# Patient Record
Sex: Male | Born: 1965
Health system: Southern US, Community
[De-identification: ages and names within clinical notes are randomized; demographics above are authoritative.]

## PROBLEM LIST (undated history)

## (undated) DIAGNOSIS — M5412 Radiculopathy, cervical region: Secondary | ICD-10-CM

## (undated) DIAGNOSIS — E785 Hyperlipidemia, unspecified: Secondary | ICD-10-CM

## (undated) DIAGNOSIS — C801 Malignant (primary) neoplasm, unspecified: Secondary | ICD-10-CM

## (undated) DIAGNOSIS — T7840XA Allergy, unspecified, initial encounter: Secondary | ICD-10-CM

## (undated) HISTORY — DX: Allergy, unspecified, initial encounter: T78.40XA

## (undated) HISTORY — DX: Radiculopathy, cervical region: M54.12

## (undated) HISTORY — DX: Hyperlipidemia, unspecified: E78.5

## (undated) HISTORY — DX: Malignant (primary) neoplasm, unspecified: C80.1

---

## 1996-08-31 HISTORY — PX: CHOLECYSTECTOMY: SHX55

## 2004-07-03 ENCOUNTER — Ambulatory Visit: Payer: Self-pay | Admitting: Internal Medicine

## 2004-07-21 ENCOUNTER — Encounter: Admission: RE | Admit: 2004-07-21 | Discharge: 2004-07-21 | Payer: Self-pay | Admitting: Internal Medicine

## 2004-07-22 ENCOUNTER — Ambulatory Visit: Payer: Self-pay | Admitting: Internal Medicine

## 2004-08-04 ENCOUNTER — Ambulatory Visit: Payer: Self-pay | Admitting: Internal Medicine

## 2004-08-08 ENCOUNTER — Ambulatory Visit: Payer: Self-pay | Admitting: Internal Medicine

## 2004-10-17 ENCOUNTER — Ambulatory Visit: Payer: Self-pay | Admitting: Internal Medicine

## 2005-01-06 ENCOUNTER — Ambulatory Visit: Payer: Self-pay | Admitting: Internal Medicine

## 2005-01-14 ENCOUNTER — Ambulatory Visit: Payer: Self-pay | Admitting: Internal Medicine

## 2005-04-16 ENCOUNTER — Ambulatory Visit: Payer: Self-pay | Admitting: Internal Medicine

## 2005-09-30 ENCOUNTER — Ambulatory Visit: Payer: Self-pay | Admitting: Internal Medicine

## 2005-12-10 ENCOUNTER — Ambulatory Visit: Payer: Self-pay | Admitting: Internal Medicine

## 2006-04-05 ENCOUNTER — Ambulatory Visit: Payer: Self-pay | Admitting: Internal Medicine

## 2006-04-19 ENCOUNTER — Ambulatory Visit: Payer: Self-pay | Admitting: Internal Medicine

## 2006-05-07 ENCOUNTER — Encounter: Admission: RE | Admit: 2006-05-07 | Discharge: 2006-08-05 | Payer: Self-pay | Admitting: Internal Medicine

## 2006-10-11 ENCOUNTER — Ambulatory Visit: Payer: Self-pay | Admitting: Internal Medicine

## 2007-01-13 DIAGNOSIS — E119 Type 2 diabetes mellitus without complications: Secondary | ICD-10-CM

## 2007-01-18 ENCOUNTER — Encounter: Payer: Self-pay | Admitting: Internal Medicine

## 2007-01-18 ENCOUNTER — Ambulatory Visit: Payer: Self-pay | Admitting: Internal Medicine

## 2007-01-31 LAB — CONVERTED CEMR LAB
BUN: 11 mg/dL (ref 6–23)
CO2: 28 meq/L (ref 19–32)
Calcium: 9.2 mg/dL (ref 8.4–10.5)
Chloride: 106 meq/L (ref 96–112)
Creatinine, Ser: 0.7 mg/dL (ref 0.4–1.5)
Creatinine,U: 202.5 mg/dL
Microalb Creat Ratio: 3.5 mg/g (ref 0.0–30.0)
Microalb, Ur: 0.7 mg/dL (ref 0.0–1.9)

## 2007-04-18 ENCOUNTER — Ambulatory Visit: Payer: Self-pay | Admitting: Internal Medicine

## 2007-04-19 LAB — CONVERTED CEMR LAB
ALT: 27 units/L (ref 0–53)
AST: 20 units/L (ref 0–37)
Basophils Absolute: 0 10*3/uL (ref 0.0–0.1)
Eosinophils Relative: 1.9 % (ref 0.0–5.0)
HCT: 41 % (ref 39.0–52.0)
Neutrophils Relative %: 50.7 % (ref 43.0–77.0)
Platelets: 293 10*3/uL (ref 150–400)
RBC: 4.65 M/uL (ref 4.22–5.81)
RDW: 12 % (ref 11.5–14.6)
WBC: 6.5 10*3/uL (ref 4.5–10.5)

## 2007-05-30 ENCOUNTER — Ambulatory Visit: Payer: Self-pay | Admitting: Internal Medicine

## 2007-06-02 ENCOUNTER — Encounter: Payer: Self-pay | Admitting: Internal Medicine

## 2007-07-19 ENCOUNTER — Ambulatory Visit: Payer: Self-pay | Admitting: Internal Medicine

## 2007-07-19 DIAGNOSIS — M7660 Achilles tendinitis, unspecified leg: Secondary | ICD-10-CM | POA: Insufficient documentation

## 2007-08-01 LAB — CONVERTED CEMR LAB
BUN: 14 mg/dL (ref 6–23)
CO2: 25 meq/L (ref 19–32)
Calcium: 9.3 mg/dL (ref 8.4–10.5)
Cholesterol: 194 mg/dL (ref 0–200)
Creatinine, Ser: 0.7 mg/dL (ref 0.4–1.5)
GFR calc Af Amer: 161 mL/min
Glucose, Bld: 130 mg/dL — ABNORMAL HIGH (ref 70–99)
Total CHOL/HDL Ratio: 4.3
Triglycerides: 104 mg/dL (ref 0–149)

## 2007-10-19 ENCOUNTER — Ambulatory Visit: Payer: Self-pay | Admitting: Internal Medicine

## 2007-10-25 LAB — CONVERTED CEMR LAB: Hgb A1c MFr Bld: 6.4 % — ABNORMAL HIGH (ref 4.6–6.0)

## 2008-02-20 ENCOUNTER — Encounter (INDEPENDENT_AMBULATORY_CARE_PROVIDER_SITE_OTHER): Payer: Self-pay | Admitting: *Deleted

## 2008-03-06 ENCOUNTER — Telehealth (INDEPENDENT_AMBULATORY_CARE_PROVIDER_SITE_OTHER): Payer: Self-pay | Admitting: *Deleted

## 2008-03-29 ENCOUNTER — Telehealth (INDEPENDENT_AMBULATORY_CARE_PROVIDER_SITE_OTHER): Payer: Self-pay | Admitting: *Deleted

## 2008-04-30 ENCOUNTER — Telehealth (INDEPENDENT_AMBULATORY_CARE_PROVIDER_SITE_OTHER): Payer: Self-pay | Admitting: *Deleted

## 2008-08-31 HISTORY — PX: SHOULDER ARTHROSCOPY: SHX128

## 2008-09-13 ENCOUNTER — Ambulatory Visit: Payer: Self-pay | Admitting: Internal Medicine

## 2008-09-18 ENCOUNTER — Encounter: Payer: Self-pay | Admitting: Internal Medicine

## 2008-09-18 ENCOUNTER — Encounter (INDEPENDENT_AMBULATORY_CARE_PROVIDER_SITE_OTHER): Payer: Self-pay | Admitting: *Deleted

## 2008-09-18 LAB — CONVERTED CEMR LAB
ALT: 33 units/L (ref 0–53)
BUN: 9 mg/dL (ref 6–23)
Calcium: 9.1 mg/dL (ref 8.4–10.5)
Cholesterol: 188 mg/dL (ref 0–200)
Creatinine, Ser: 0.7 mg/dL (ref 0.4–1.5)
GFR calc Af Amer: 161 mL/min
GFR calc non Af Amer: 133 mL/min
Glucose, Bld: 105 mg/dL — ABNORMAL HIGH (ref 70–99)
HDL: 47.8 mg/dL (ref >39.0)
LDL Cholesterol: 121 mg/dL — ABNORMAL HIGH (ref 0–99)
Microalb Creat Ratio: 5.2 mg/g (ref 0.0–30.0)
Potassium: 4 meq/L (ref 3.5–5.1)
Triglycerides: 97 mg/dL (ref 0–149)
VLDL: 19 mg/dL (ref 0–40)

## 2008-12-12 ENCOUNTER — Encounter (INDEPENDENT_AMBULATORY_CARE_PROVIDER_SITE_OTHER): Payer: Self-pay | Admitting: *Deleted

## 2009-01-14 ENCOUNTER — Ambulatory Visit: Payer: Self-pay | Admitting: Family Medicine

## 2009-01-14 DIAGNOSIS — L500 Allergic urticaria: Secondary | ICD-10-CM

## 2009-01-14 DIAGNOSIS — J301 Allergic rhinitis due to pollen: Secondary | ICD-10-CM

## 2009-01-22 ENCOUNTER — Encounter (INDEPENDENT_AMBULATORY_CARE_PROVIDER_SITE_OTHER): Payer: Self-pay | Admitting: *Deleted

## 2009-01-22 LAB — CONVERTED CEMR LAB: IgE (Immunoglobulin E), Serum: 201.2 intl units/mL — ABNORMAL HIGH (ref 0.0–180.0)

## 2009-01-29 ENCOUNTER — Encounter (INDEPENDENT_AMBULATORY_CARE_PROVIDER_SITE_OTHER): Payer: Self-pay | Admitting: *Deleted

## 2009-02-20 ENCOUNTER — Telehealth: Payer: Self-pay | Admitting: Internal Medicine

## 2009-04-23 ENCOUNTER — Encounter: Payer: Self-pay | Admitting: Internal Medicine

## 2009-06-18 ENCOUNTER — Ambulatory Visit: Payer: Self-pay | Admitting: Internal Medicine

## 2009-06-24 LAB — CONVERTED CEMR LAB
ALT: 29 units/L (ref 0–53)
AST: 21 units/L (ref 0–37)
CO2: 25 meq/L (ref 19–32)
Calcium: 9 mg/dL (ref 8.4–10.5)
Creatinine, Ser: 0.8 mg/dL (ref 0.4–1.5)
GFR calc non Af Amer: 113.19 mL/min (ref >60)
Hgb A1c MFr Bld: 6.7 % — ABNORMAL HIGH (ref 4.6–6.5)
Sodium: 141 meq/L (ref 135–145)

## 2009-09-13 ENCOUNTER — Ambulatory Visit: Payer: Self-pay | Admitting: Internal Medicine

## 2009-09-13 DIAGNOSIS — E7800 Pure hypercholesterolemia, unspecified: Secondary | ICD-10-CM

## 2009-09-19 ENCOUNTER — Encounter (INDEPENDENT_AMBULATORY_CARE_PROVIDER_SITE_OTHER): Payer: Self-pay | Admitting: *Deleted

## 2009-09-19 LAB — CONVERTED CEMR LAB
Cholesterol: 190 mg/dL (ref 0–200)
LDL Cholesterol: 120 mg/dL — ABNORMAL HIGH (ref 0–99)
Triglycerides: 92 mg/dL (ref 0.0–149.0)

## 2009-12-17 ENCOUNTER — Ambulatory Visit: Payer: Self-pay | Admitting: Internal Medicine

## 2010-01-06 ENCOUNTER — Ambulatory Visit: Payer: Self-pay | Admitting: Family Medicine

## 2010-01-06 ENCOUNTER — Encounter (INDEPENDENT_AMBULATORY_CARE_PROVIDER_SITE_OTHER): Payer: Self-pay | Admitting: *Deleted

## 2010-03-18 ENCOUNTER — Ambulatory Visit: Payer: Self-pay | Admitting: Internal Medicine

## 2010-05-14 ENCOUNTER — Telehealth (INDEPENDENT_AMBULATORY_CARE_PROVIDER_SITE_OTHER): Payer: Self-pay | Admitting: *Deleted

## 2010-06-04 ENCOUNTER — Ambulatory Visit: Payer: Self-pay | Admitting: Internal Medicine

## 2010-06-04 DIAGNOSIS — M549 Dorsalgia, unspecified: Secondary | ICD-10-CM | POA: Insufficient documentation

## 2010-06-04 LAB — HM DIABETES FOOT EXAM

## 2010-06-05 LAB — CONVERTED CEMR LAB: Hgb A1c MFr Bld: 7.2 % — ABNORMAL HIGH (ref 4.6–6.5)

## 2010-06-16 ENCOUNTER — Encounter
Admission: RE | Admit: 2010-06-16 | Discharge: 2010-06-16 | Payer: Self-pay | Source: Home / Self Care | Attending: Internal Medicine | Admitting: Internal Medicine

## 2010-09-28 LAB — CONVERTED CEMR LAB
BUN: 7 mg/dL (ref 6–23)
Basophils Absolute: 0 10*3/uL (ref 0.0–0.1)
Bilirubin, Direct: 0.1 mg/dL (ref 0.0–0.3)
Chloride: 107 meq/L (ref 96–112)
Cholesterol: 172 mg/dL (ref 0–200)
Creatinine, Ser: 0.7 mg/dL (ref 0.4–1.5)
Eosinophils Absolute: 0.1 10*3/uL (ref 0.0–0.7)
Glucose, Bld: 128 mg/dL — ABNORMAL HIGH (ref 70–99)
HCT: 42.1 % (ref 39.0–52.0)
HDL: 47.6 mg/dL (ref >39.00)
LDL Cholesterol: 92 mg/dL (ref 0–99)
Lymphs Abs: 2 10*3/uL (ref 0.7–4.0)
MCV: 90.2 fL (ref 78.0–100.0)
Microalb Creat Ratio: 6 mg/g (ref 0.0–30.0)
Monocytes Absolute: 0.6 10*3/uL (ref 0.1–1.0)
Neutrophils Relative %: 51.4 % (ref 43.0–77.0)
Platelets: 259 10*3/uL (ref 150.0–400.0)
Potassium: 3.7 meq/L (ref 3.5–5.1)
RDW: 12.6 % (ref 11.5–14.6)
Total Bilirubin: 0.7 mg/dL (ref 0.3–1.2)
Total CHOL/HDL Ratio: 4
Triglycerides: 163 mg/dL — ABNORMAL HIGH (ref 0.0–149.0)
VLDL: 32.6 mg/dL (ref 0.0–40.0)
WBC: 5.8 10*3/uL (ref 4.5–10.5)

## 2010-09-30 NOTE — Letter (Signed)
Summary: lipid results      [place] [facility] [address] [place], [place]  [address] Phone: [phone]      September 19, 2009   [name] [address] [place], [place] [address]  RE:  LAB RESULTS  Dear  [name],  The following is an interpretation of your most recent lab tests.  Please take note of any instructions provided or changes to medications that have resulted from your lab work.  LIPID PANEL:  Please see the enclosed Rx for a change in medication Triglyceride: 92.0   Cholesterol: 190   LDL: 120   HDL: 51.70   Chol/HDL%:  4   Dr. Drue Novel wants your LDL (bad cholesterol)  to be less than 100.  Please increase simvastatin to 40mg .  I have enclosed a prescription.  Call me if you have any questions or concerns. [phone] ext [phone]   Sincerely Yours,    [name], CMA

## 2010-09-30 NOTE — Assessment & Plan Note (Signed)
Summary: PER PHONE NOTE, NEEDS OFFICE VISIT--WILL COME FASTING///SPH   Vital Signs:  Patient profile:   45 year old male Weight:      292 pounds Pulse rate:   76 / minute Pulse rhythm:   regular BP sitting:   126 / 82  (left arm) Cuff size:   large  Vitals Entered By: Army Fossa CMA (June 04, 2010 8:43 AM) CC: follow up visit, fasting  Comments c/o lower back pain x 3 weeks (feels muscle releated)  [facility] [address]    History of Present Illness: office visit  DIABETES--  ambulatory CBGs in the 170s as far as his diet, reports he has eliminated fast food , started to walk up until 3 weeks ago d/t back pain, see below  Hyperlipidemia-- off meds, las FLP satisfactory    c/o lower back pain x 3 weeks , B lower back no radiation, no assoc symptoms like tingling in the LE, has been using local heat and tylenol, no injury that he can tell ; better w/ a hot shower, worse if stays in the same position x a while   Current Medications (verified): 1)  Amaryl 4 Mg Tabs (Glimepiride) .... Take 1 Tablet By Mouth Once A Day 2)  Actos 45 Mg Tabs (Pioglitazone Hcl) .... Take 1 Tablet By Mouth Once A Day 3)  Xyzal 5 Mg Tabs (Levocetirizine Dihydrochloride) .Marland Kitchen.. 1 By Mouth Once Daily 4)  Ketoconazole 2 % Crea (Ketoconazole) .... Two Times A Day X 2 Weeks 5)  One Touch Ultra 2 Test Strips & Lancets .... Checks Bs 2x/day; Dx 250.00  Allergies (verified): No Known Drug Allergies  Past History:  Past Medical History: Reviewed history from 09/13/2009 and no changes required. DIABETES MELLITUS, TYPE II , Dx 98 Hyperlipidemia  Past Surgical History: Reviewed history from 09/13/2008 and no changes required. Cholecystectomy '98  Social History: Reviewed history from 12/17/2009 and no changes required. Married 1 daughter works at [employer]  tobacco-- no ETOH-- rarely not exercising  diet-- on-off   Review of Systems General:  Denies fever. CV:   Denies chest pain or discomfort and shortness of breath with exertion. Resp:  Denies cough and shortness of breath. GI:  Denies nausea and vomiting. GU:  Denies dysuria and hematuria. Endo:   low sugar symptoms x 2? , had skip a meal and got a headache, he feels that HA related to low sugar.  Physical Exam  General:  alert, well-developed, and overweight-appearing.   Lungs:  normal respiratory effort, no intercostal retractions, no accessory muscle use, and normal breath sounds.   Heart:  normal rate, regular rhythm, no murmur, and no gallop.   Pulses:  normal pedal pulses bilaterally  Extremities:  no pretibial edema bilaterally  Neurologic:  DTRs, motor exam wnl @ LE  Diabetes Management Exam:    Foot Exam (with socks and/or shoes not present):       Sensory-Pinprick/Light touch:          Left medial foot (L-4): normal          Left dorsal foot (L-5): normal          Left lateral foot (S-1): normal          Right medial foot (L-4): normal          Right dorsal foot (L-5): normal          Right lateral foot (S-1): normal       Sensory-Monofilament:  Left foot: normal          Right foot: normal       Inspection:          Left foot: normal          Right foot: normal       Nails:          Left foot: normal          Right foot: normal   Impression & Recommendations:  Problem # 1:  BACK PAIN (ICD-724.5) Assessment New likely muscle skeletal. see instructions   His updated medication list for this problem includes:    Flexeril 10 Mg Tabs (Cyclobenzaprine hcl) .Marland Kitchen... 1 by mouth at bedtime as needed pain  Problem # 2:  DIABETES MELLITUS, TYPE II (ICD-250.00) labs had a conversation w/ patient, not loosing weight depite "quitting fast food"; later on , he admited  to eating poorly (lots of carbs, large portions) we agreed to refer to  a nutritionist,  exercise more  labs  His updated medication list for this problem includes:    Amaryl 4 Mg Tabs (Glimepiride) .Marland Kitchen...  Take 1 tablet by mouth once a day    Actos 45 Mg Tabs (Pioglitazone hcl) .Marland Kitchen... Take 1 tablet by mouth once a day  Orders: Venipuncture (75643) TLB-A1C / Hgb A1C (Glycohemoglobin) (83036-A1C) Specimen Handling (32951) Nutrition Referral (Nutrition)  Problem # 3:  HYPERLIPIDEMIA (ICD-272.4) not on meds  last FLP satisfactory   Labs Reviewed: SGOT: 18 (12/17/2009)   SGPT: 25 (12/17/2009)   HDL:47.60 (12/17/2009), 51.70 (09/13/2009)  LDL:92 (12/17/2009), 120 (88/41/6606)  Chol:172 (12/17/2009), 190 (09/13/2009)  Trig:163.0 (12/17/2009), 92.0 (09/13/2009)  Complete Medication List: 1)  Amaryl 4 Mg Tabs (Glimepiride) .... Take 1 tablet by mouth once a day 2)  Actos 45 Mg Tabs (Pioglitazone hcl) .... Take 1 tablet by mouth once a day 3)  Xyzal 5 Mg Tabs (Levocetirizine dihydrochloride) .Marland Kitchen.. 1 by mouth once daily 4)  Ketoconazole 2 % Crea (Ketoconazole) .... Two times a day x 2 weeks 5)  One Touch Ultra 2 Test Strips & Lancets  .... Checks bs 2x/day; dx 250.00 6)  Flexeril 10 Mg Tabs (Cyclobenzaprine hcl) .Marland Kitchen.. 1 by mouth at bedtime as needed pain  Other Orders: Admin 1st Vaccine (30160) Flu Vaccine 59yrs + ([address])   Patient Instructions: 1)  rest, warm compress 2)  Take -600 mg of Ibuprofen (Advil, Motrin) with food every 8 hours as needed  for relief of pain, watch for stomach side effects 3)  flexeril, muscle relaxant at night, will cause drowsines 4)  call if no better in 3 weeks  5)  Please schedule a follow-up appointment in 4 months .  6)  exercise reguarly at least 30  minutes 6 times a week. Prescriptions: FLEXERIL 10 MG TABS (CYCLOBENZAPRINE HCL) 1 by mouth at bedtime as needed pain  #21 x 0   Entered and Authorized by:   Nolon Rod. Paz MD   Signed by:   Nolon Rod. Paz MD on 06/04/2010   Method used:   [facility] to        [facility] [phone]* (retail)       [address]       [place], [place]  [address]       Ph: [phone]       Fax:  [phone]   RxID:   [id number]  Flu Vaccine Consent Questions     Do you  have a history of severe allergic reactions to this vaccine? no    Any prior history of allergic reactions to egg and/or gelatin? no    Do you have a sensitivity to the preservative Thimersol? no    Do you have a past history of Guillan-Barre Syndrome? no    Do you currently have an acute febrile illness? no    Have you ever had a severe reaction to latex? no    Vaccine information given and explained to patient? yes    Are you currently pregnant? no    Lot Number:[id number]   Exp Date:02/28/2011   Site Given  Left Deltoid IM       Ph: [phone]       Fax: [phone]   RxID:   [id number]    .lbflu

## 2010-09-30 NOTE — Assessment & Plan Note (Signed)
Summary: CPX/NS/KDC   Vital Signs:  Patient profile:   45 year old male Height:      70 inches Weight:      288.6 pounds BMI:     41.56 Pulse rate:   90 / minute BP sitting:   122 / 80  Vitals Entered By: [name] (December 17, 2009 8:48 AM) CC: cpx - fasting Is Patient Diabetic? Yes Comments FBS this am 156, avg BS @ home 160's [name]  December 17, 2009 8:49 AM    History of Present Illness: CPX --fungal infection on the foot came back in another spot, using left over cream --still having problems w/ achilles tendon pain on-off x 2 years , always a little swollen. Pain increased afetr physical activity --based on the last cholesterol panel, simvastatin was increased, patient however not taking it   Preventive Screening-Counseling & Management  Alcohol-Tobacco     Alcohol drinks/day: <1  Current Medications (verified): 1)  Amaryl 4 Mg Tabs (Glimepiride) .... Take 1 Tablet By Mouth Once A Day 2)  Actos 45 Mg Tabs (Pioglitazone Hcl) .... Take 1 Tablet By Mouth Once A Day 3)  Singulair 10 Mg Tabs (Montelukast Sodium) .Marland Kitchen.. 1 By Mouth Once Daily 4)  Xyzal 5 Mg Tabs (Levocetirizine Dihydrochloride) .Marland Kitchen.. 1 By Mouth Once Daily 5)  One Touch Ultra 2 Test Strips & Lancets .... Checks Bs 2x/day; Dx 250.00  Allergies (verified): No Known Drug Allergies  Past History:  Past Medical History: Reviewed history from 09/13/2009 and no changes required. DIABETES MELLITUS, TYPE II , Dx 98 Hyperlipidemia  Past Surgical History: Reviewed history from 09/13/2008 and no changes required. Cholecystectomy '98  Family History: Reviewed history from 09/13/2008 and no changes required. colon ca--no  prostate ca--no breast ca--M, aunt DM-- (+)  GM M uncle  MI-- no  Social History: Married 1 daughter works at [employer]  tobacco-- no ETOH-- rarely not exercising  diet-- on-off   Review of Systems General:  Denies fatigue, fever, and weight loss; no exercising at all  "is a matter of lack of time and priorities". CV:  Denies chest pain or discomfort and swelling of feet. Resp:  Denies cough and shortness of breath. GI:  Denies bloody stools, nausea, and vomiting. GU:  Denies hematuria, urinary frequency, and urinary hesitancy.  Physical Exam  General:  alert, well-developed, well-nourished, and overweight-appearing.   Neck:  no masses and no thyromegaly.   Lungs:  normal respiratory effort, no intercostal retractions, no accessory muscle use, and normal breath sounds.   Heart:  normal rate, regular rhythm, no murmur, and no gallop.   Abdomen:  soft, non-tender, normal bowel sounds, and no distention.   Extremities:  no pretibial edema R distal Achilles' tendon at the insertion to the heel-- slightly swollen, no red, no warm Skin:  L foot red-dry-few dry scaly skin on the sides  of the foot Psych:  Oriented X3, memory intact for recent and remote, normally interactive, good eye contact, not anxious appearing, and not depressed appearing.     Impression & Recommendations:  Problem # 1:  ROUTINE GENERAL MEDICAL EXAM@HEALTH  CARE FACL (ICD-V70.0) Td 4-11 EKG--left axis deviation, no old EKGs, patient is asymptomatic Labs as far as his lifestyle I provided him  with information about diet, exercise; I offered a  referral to a nutritionist.   Benefits of a healthy lifestyle discussed  Orders: Venipuncture (95284) TLB-BMP (Basic Metabolic Panel-BMET) (80048-METABOL) TLB-CBC Platelet - w/Differential (85025-CBCD) TLB-Hepatic/Liver Function Pnl (80076-HEPATIC) TLB-TSH (Thyroid  Stimulating Hormone) (84443-TSH)  Problem # 2:  HYPERLIPIDEMIA (ICD-272.4) not taking simvastatin, reason? The following medications were removed from the medication list:    Simvastatin 40 Mg Tabs (Simvastatin) .Marland Kitchen... 1 by mouth once daily  Labs Reviewed: SGOT: 21 (06/18/2009)   SGPT: 29 (06/18/2009)   HDL:51.70 (09/13/2009), 47.8 (09/13/2008)  LDL:120 (09/13/2009), 121  (09/13/2008)  Chol:190 (09/13/2009), 188 (09/13/2008)  Trig:92.0 (09/13/2009), 97 (09/13/2008)  Orders: TLB-Lipid Panel (80061-LIPID)  Problem # 3:  FUNGAL DERMATITIS (ICD-111.9) continually ketoconazole for at least two weeks His updated medication list for this problem includes:    Ketoconazole 2 % Crea (Ketoconazole) .Marland Kitchen..Marland Kitchen Two times a day x 2 weeks  Problem # 4:  ACHILLES TENDINITIS (ICD-726.71) to ortho , declined referal, states he will call   Problem # 5:  DIABETES MELLITUS, TYPE II (ICD-250.00) labs  His updated medication list for this problem includes:    Amaryl 4 Mg Tabs (Glimepiride) .Marland Kitchen... Take 1 tablet by mouth once a day    Actos 45 Mg Tabs (Pioglitazone hcl) .Marland Kitchen... Take 1 tablet by mouth once a day  Labs Reviewed: Creat: 0.8 (06/18/2009)    Reviewed HgBA1c results: 7.3 (09/13/2009)  6.7 (06/18/2009)  Orders: TLB-Microalbumin/Creat Ratio, Urine (82043-MALB) TLB-A1C / Hgb A1C (Glycohemoglobin) (83036-A1C)  Complete Medication List: 1)  Amaryl 4 Mg Tabs (Glimepiride) .... Take 1 tablet by mouth once a day 2)  Actos 45 Mg Tabs (Pioglitazone hcl) .... Take 1 tablet by mouth once a day 3)  Singulair 10 Mg Tabs (Montelukast sodium) .Marland Kitchen.. 1 by mouth once daily 4)  Xyzal 5 Mg Tabs (Levocetirizine dihydrochloride) .Marland Kitchen.. 1 by mouth once daily 5)  Ketoconazole 2 % Crea (Ketoconazole) .... Two times a day x 2 weeks 6)  One Touch Ultra 2 Test Strips & Lancets  .... Checks bs 2x/day; dx 250.00  Other Orders: Tdap => 81yrs IM ([address]) Admin 1st Vaccine (60454)  Patient Instructions: 1)  Please schedule a follow-up appointment in 3 months .  Prescriptions: KETOCONAZOLE 2 % CREA (KETOCONAZOLE) two times a day x 2 weeks  #1 x 1   Entered and Authorized by:   Elita Quick E. Elizjah Noblet MD   Signed by:   Nolon Rod. Juliannah Ohmann MD on 12/17/2009   Method used:   [facility] to        [facility] [phone]* (retail)       [address]       [place], [place]  [address]        Ph: [phone]       Fax: [phone]   RxID:   [id number]    Preventive Care Screening  Prior Values:    Last Flu Shot:  Fluvax 3+ (09/13/2009)    Family History:    Reviewed history from 09/13/2008 and no changes required:       colon ca--no        prostate ca--no       breast ca--M, aunt       DM-- (+)  GM M uncle        MI-- no  Social History:    Married    1 daughter    works at [employer]     tobacco-- no    ETOH-- rarely    not exercising     diet-- on-off     Risk Factors:  Tobacco use:  never Alcohol use:  yes    Drinks per day:  <1  Comments:  pt will occasionally have a beer Exercise:  no    Immunizations Administered:  Tetanus Vaccine:    Vaccine Type: Tdap    Site: right deltoid    Mfr: GlaxoSmithKline    Dose: 0.5 ml    Route: IM    Given by: [name]    Exp. Date: 11/23/2011    Lot #: [id number]

## 2010-09-30 NOTE — Assessment & Plan Note (Signed)
Summary: cyst on back on neck/alr   Vital Signs:  Patient profile:   45 year old male Weight:      291 pounds Pulse rate:   80 / minute BP sitting:   128 / 82  (left arm)  Vitals Entered By: Doristine Devoid (Jan 06, 2010 1:18 PM) CC: cyst on back neck now painful    History of Present Illness: 45 yo man here today w/ cyst on the back of his neck.  became painful Friday, Saturday was painful to touch, Sunday was painful w/ neck motion.  cyst has been present for years.  no fevers, drainage, redness.  cyst has been 'drained' before and pt was told it would likely recur.  Current Medications (verified): 1)  Amaryl 4 Mg Tabs (Glimepiride) .... Take 1 Tablet By Mouth Once A Day 2)  Actos 45 Mg Tabs (Pioglitazone Hcl) .... Take 1 Tablet By Mouth Once A Day 3)  Singulair 10 Mg Tabs (Montelukast Sodium) .Marland Kitchen.. 1 By Mouth Once Daily 4)  Xyzal 5 Mg Tabs (Levocetirizine Dihydrochloride) .Marland Kitchen.. 1 By Mouth Once Daily 5)  Ketoconazole 2 % Crea (Ketoconazole) .... Two Times A Day X 2 Weeks 6)  One Touch Ultra 2 Test Strips & Lancets .... Checks Bs 2x/day; Dx 250.00  Allergies (verified): No Known Drug Allergies  Review of Systems      See HPI  Physical Exam  General:  Well-developed,well-nourished,in no acute distress; alert,appropriate and cooperative throughout examination Neck:  1.5 x 1 inch fluctuant cyst, no erythema, visible drainage.  + TTP Cervical Nodes:  No lymphadenopathy noted   Impression & Recommendations:  Problem # 1:  SEBACEOUS CYST, NECK (ICD-706.2) Assessment New pt reports this has been present for years but recently painful.  no erythema, pus, fever, or other signs of infxn that would require abx.  will refer pt to surgery for removal.  if pt can not be seen in Urgent Office this afternoon pt would like to have area drained to relieve sxs.  given that area does not look infected will defer aspiration or incision.  pt to see surgery this week.  reviewed supportive care and  red flags that should prompt return.  Pt expresses understanding and is in agreement w/ this plan.  Complete Medication List: 1)  Amaryl 4 Mg Tabs (Glimepiride) .... Take 1 tablet by mouth once a day 2)  Actos 45 Mg Tabs (Pioglitazone hcl) .... Take 1 tablet by mouth once a day 3)  Singulair 10 Mg Tabs (Montelukast sodium) .Marland Kitchen.. 1 by mouth once daily 4)  Xyzal 5 Mg Tabs (Levocetirizine dihydrochloride) .Marland Kitchen.. 1 by mouth once daily 5)  Ketoconazole 2 % Crea (Ketoconazole) .... Two times a day x 2 weeks 6)  One Touch Ultra 2 Test Strips & Lancets  .... Checks bs 2x/day; dx 250.00  Patient Instructions: 1)  Please go to [facility] for your appt- we'll call you with your appt time 2)  Ibuprofen for pain 3)  If streaking redness, pus, or other concerns- please call 4)  Hang in there!

## 2010-09-30 NOTE — Miscellaneous (Signed)
  Clinical Lists Changes  Orders: Added new Referral order of Surgical Referral (Surgery) - Signed 

## 2010-09-30 NOTE — Progress Notes (Signed)
Summary: appt  Phone Note Outgoing Call   Summary of Call: Pt needs a f/u visit with Dr.Paz. Army Fossa CMA  May 14, 2010 8:06 AM   Follow-up for Phone Call        [facility] HAS APPT FOR 10/8 AT 8:40---WILL COME FASTING Follow-up by: [name],  May 20, 2010 4:24 PM

## 2010-09-30 NOTE — Assessment & Plan Note (Signed)
Summary: 3 MTH FU/NS/KDC  Flu Vaccine Consent Questions     Do you have a history of severe allergic reactions to this vaccine? no    Any prior history of allergic reactions to egg and/or gelatin? no    Do you have a sensitivity to the preservative Thimersol? no    Do you have a past history of Guillan-Barre Syndrome? no    Do you currently have an acute febrile illness? no    Have you ever had a severe reaction to latex? no    Vaccine information given and explained to patient? yes    Are you currently pregnant? no    Lot Number:[id number]   Exp Date:02/27/2010   Site Given  right Deltoid IM [name]  September 13, 2009 9:28 AM   Vital Signs:  Patient profile:   45 year old male Height:      70 inches Weight:      288.4 pounds BMI:     41.53 Pulse rate:   74 / minute BP sitting:   140 / 92  Vitals Entered By: [name] (September 13, 2009 9:00 AM) CC: rov - fasting Is Patient Diabetic? Yes   History of Present Illness: DM doing well no ambulatory BPs   Current Medications (verified): 1)  Amaryl 4 Mg Tabs (Glimepiride) .... Take 1 Tablet By Mouth Once A Day 2)  Actos 45 Mg Tabs (Pioglitazone Hcl) .... Take 1 Tablet By Mouth Once A Day 3)  Simvastatin 20 Mg Tabs (Simvastatin) .Marland Kitchen.. 1 By Mouth At Bedtime 4)  Singulair 10 Mg Tabs (Montelukast Sodium) .Marland Kitchen.. 1 By Mouth Once Daily 5)  Xyzal 5 Mg Tabs (Levocetirizine Dihydrochloride) .Marland Kitchen.. 1 By Mouth Once Daily  Allergies (verified): No Known Drug Allergies  Past History:  Past Medical History: DIABETES MELLITUS, TYPE II , Dx 98 Hyperlipidemia  Past Surgical History: Reviewed history from 09/13/2008 and no changes required. 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 MS:  Denies muscle aches. Endo:   ambulatory CBGs -- not checking. lost monitor diet-- not good since Christmas , starting to get back in track exercise -- infrecuent.  Physical Exam  General:  alert, well-developed, and overweight-appearing.   Lungs:  Normal respiratory effort, chest expands symmetrically. Lungs are clear to auscultation  Heart:  normal rate and regular rhythm.   Pulses:  normal pedal pulses bilaterally  Extremities:  no pretibial edema bilaterally  Skin:  L foot: has a rash at the base of great toe-plantar area ... red-dry-few dry blister ina "C" shape   Diabetes Management Exam:    Foot Exam (with socks and/or shoes not present):       Sensory-Pinprick/Light touch:          Left medial foot (L-4): normal          Left dorsal foot (L-5): normal          Left lateral foot (S-1): normal          Right medial foot (L-4): normal          Right dorsal foot (L-5): normal          Right lateral foot (S-1): normal       Sensory-Monofilament:  Left foot: normal          Right foot: normal       Inspection:          Left foot: abnormal          Right foot: normal       Nails:          Left foot: normal          Right foot: normal   Impression & Recommendations:  Problem # 1:  DIABETES MELLITUS, TYPE II (ICD-250.00) diet-exercise encouraged  eye exam recommended  labs  His updated medication list for this problem includes:    Amaryl 4 Mg Tabs (Glimepiride) .Marland Kitchen... Take 1 tablet by mouth once a day    Actos 45 Mg Tabs (Pioglitazone hcl) .Marland Kitchen... Take 1 tablet by mouth once a day  Orders: Venipuncture (04540) TLB-A1C / Hgb A1C (Glycohemoglobin) (83036-A1C)  Problem # 2:  ROUTINE GENERAL MEDICAL EXAM@HEALTH  CARE FACL (ICD-V70.0) due for CPX flu shot today  Problem # 3:  FUNGAL DERMATITIS (ICD-111.9) see exam  likley has a fungal dermatitis ketokonzole  His updated medication list for this problem includes:    Ketoconazole 2 % Crea (Ketoconazole) .Marland Kitchen..Marland Kitchen Two times a day x 2  weeks  Problem # 4:  ALLERGIC URTICARIA (ICD-708.0) better, s/p allergist referal on xyzal and singulair   Complete Medication List: 1)  Amaryl 4 Mg Tabs (Glimepiride) .... Take 1 tablet by mouth once a day 2)  Actos 45 Mg Tabs (Pioglitazone hcl) .... Take 1 tablet by mouth once a day 3)  Simvastatin 20 Mg Tabs (Simvastatin) .Marland Kitchen.. 1 by mouth at bedtime 4)  Singulair 10 Mg Tabs (Montelukast sodium) .Marland Kitchen.. 1 by mouth once daily 5)  Xyzal 5 Mg Tabs (Levocetirizine dihydrochloride) .Marland Kitchen.. 1 by mouth once daily 6)  Ketoconazole 2 % Crea (Ketoconazole) .... Two times a day x 2 weeks 7)  One Touch Ultra 2 Test Strips & Lancets  .... Checks bs 2x/day; dx 250.00  Other Orders: Admin 1st Vaccine (98119) Flu Vaccine 22yrs + (14782) TLB-Lipid Panel (80061-LIPID)  Patient Instructions: 1)  Please schedule a follow-up appointment in 3 months ,  physical exam 2)  use the cream as prescribed , call if no better  Prescriptions: ONE TOUCH ULTRA 2 TEST STRIPS & LANCETS checks BS 2x/day; dx 250.00  #1 mo supp x 11   Entered by:   [name]   Authorized by:   Nolon Rod. Paz MD   Signed by:   [name] on 09/13/2009   Method used:   [facility]ed to ...       [facility] [phone]* (retail)       [address]       [place], [place]  [address]       Ph: [phone]       Fax: [phone]   RxID:   [id number] KETOCONAZOLE 2 % CREA (KETOCONAZOLE) two times a day x 2 weeks  #1 x 1   Entered and Authorized by:   Nolon Rod. Paz MD   Signed by:   Nolon Rod. Paz MD on 09/13/2009   Method used:   [facility]ronically to        [facility] [phone]* (retail)       [address]       [place], [place]  [address]       Ph:  [phone]       Fax: [phone]   RxID:   [id number]

## 2010-10-07 ENCOUNTER — Ambulatory Visit (INDEPENDENT_AMBULATORY_CARE_PROVIDER_SITE_OTHER): Payer: Managed Care, Other (non HMO) | Admitting: Internal Medicine

## 2010-10-07 ENCOUNTER — Encounter: Payer: Self-pay | Admitting: Internal Medicine

## 2010-10-07 ENCOUNTER — Other Ambulatory Visit: Payer: Self-pay | Admitting: Internal Medicine

## 2010-10-07 DIAGNOSIS — E119 Type 2 diabetes mellitus without complications: Secondary | ICD-10-CM

## 2010-10-07 DIAGNOSIS — M549 Dorsalgia, unspecified: Secondary | ICD-10-CM

## 2010-10-07 DIAGNOSIS — E785 Hyperlipidemia, unspecified: Secondary | ICD-10-CM

## 2010-10-07 LAB — HEMOGLOBIN A1C: Hgb A1c MFr Bld: 9.4 % — ABNORMAL HIGH (ref 4.6–6.5)

## 2010-10-07 LAB — LIPID PANEL
Cholesterol: 209 mg/dL — ABNORMAL HIGH (ref 0–200)
HDL: 44.7 mg/dL (ref >39.00)

## 2010-10-16 NOTE — Assessment & Plan Note (Signed)
Summary: 4 month OV   Vital Signs:  Patient profile:   45 year old male Height:      70 inches Weight:      270.50 pounds BMI:     38.95 Pulse rate:   82 / minute Pulse rhythm:   regular BP sitting:   122 / 86  (left arm) Cuff size:   large  Vitals Entered By: Army Fossa CMA (October 07, 2010 8:17 AM) CC: 4 month f/u- fasting  Comments c/o still having ongoing back [facility]ib [facility] [address]   History of Present Illness: routine office visit He self discontinued Actos do to the news he heard about it ( bladder cancer) doing a lot  better with diet, still not exercising. Has lost more than 20 pounds  ROS CBGs were in the 220 , after he discontinued Actos and start exercising,CBGs went down to 180. He has not checked his CBGs lately  No nausea, vomiting, diarrhea Occasional poor vision Continue with low back pain without radiation, take Flexeril and Motrin from time to time with mild help  Current Medications (verified): 1)  Amaryl 4 Mg Tabs (Glimepiride) .... Take 1 Tablet By Mouth Once A Day 2)  Xyzal 5 Mg Tabs (Levocetirizine Dihydrochloride) .Marland Kitchen.. 1 By Mouth Once Daily 3)  Ketoconazole 2 % Crea (Ketoconazole) .... Two Times A Day X 2 Weeks 4)  One Touch Ultra 2 Test Strips & Lancets .... Checks Bs 2x/day; Dx 250.00 5)  Flexeril 10 Mg Tabs (Cyclobenzaprine Hcl) .Marland Kitchen.. 1 By Mouth At Bedtime As Needed Pain  Allergies (verified): No Known Drug Allergies  Past History:  Past Medical History: Reviewed history from 09/13/2009 and no changes required. DIABETES MELLITUS, TYPE II , Dx 98 Hyperlipidemia  Past Surgical History: Reviewed history from 09/13/2008 and no changes required. Cholecystectomy '98  Social History: Reviewed history from 12/17/2009 and no changes required. Married 1 daughter works at [employer]  tobacco-- no ETOH-- rarely not exercising  diet-- on-off   Physical Exam  General:  alert and well-developed.  has lost weight Lungs:  normal  respiratory effort, no intercostal retractions, no accessory muscle use, and normal breath sounds.   Heart:  normal rate, regular rhythm, no murmur, and no gallop.   Msk:  not tender to palpation of the lower back Extremities:  no pretibial edema bilaterally  Neurologic:  alert & oriented X3, strength normal in all extremities, gait normal, and DTRs symmetrical and normal.     Impression & Recommendations:  Problem # 1:  HYPERLIPIDEMIA (ICD-272.4) on no medicines, labs Labs Reviewed: SGOT: 18 (12/17/2009)   SGPT: 25 (12/17/2009)   HDL:47.60 (12/17/2009), 51.70 (09/13/2009)  LDL:92 (12/17/2009), 120 (19/14/7829)  Chol:172 (12/17/2009), 190 (09/13/2009)  Trig:163.0 (12/17/2009), 92.0 (09/13/2009)  Orders: Venipuncture (56213) TLB-Lipid Panel (80061-LIPID) Specimen Handling (08657)  Problem # 2:  BACK PAIN (ICD-724.5) back pain w/o radicular symptoms likely will benefit from seeing a chiropractor. Referrald tone His updated medication list for this problem includes:    Flexeril 10 Mg Tabs (Cyclobenzaprine hcl) .Marland Kitchen... 1 by mouth at bedtime as needed pain  Orders: Misc. Referral (Misc. Ref)  Problem # 3:  DIABETES MELLITUS, TYPE II (ICD-250.00)  self discontinued Actos due to fear of cancer He is doing great with diet, has lost  20 pounds, praised!! encouraged  exercise Encouraged to see an eye doctor Labs If his A1c is only slightly above 7, I will leave him on Amaryl; if the A1c is much more over 7, we'll consider  [place]  The following medications were removed from the medication list:    Actos 45 Mg Tabs (Pioglitazone hcl) .Marland Kitchen... Take 1 tablet by mouth once a day His updated medication list for this problem includes:    Amaryl 4 Mg Tabs (Glimepiride) .Marland Kitchen... Take 1 tablet by mouth once a day  Orders: TLB-A1C / Hgb A1C (Glycohemoglobin) (83036-A1C) Specimen Handling (16109)  Complete Medication List: 1)  Amaryl 4 Mg Tabs (Glimepiride) .... Take 1 tablet by mouth once a  day 2)  Xyzal 5 Mg Tabs (Levocetirizine dihydrochloride) .Marland Kitchen.. 1 by mouth once daily 3)  Flexeril 10 Mg Tabs (Cyclobenzaprine hcl) .Marland Kitchen.. 1 by mouth at bedtime as needed pain 4)  One Touch Ultra 2 Test Strips & Lancets  .... Checks bs 2x/day; dx 250.00  Patient Instructions: 1)  Please schedule a follow-up appointment in 3 to  4 months .    Orders Added: 1)  Venipuncture [36415] 2)  TLB-A1C / Hgb A1C (Glycohemoglobin) [83036-A1C] 3)  TLB-Lipid Panel [80061-LIPID] 4)  Specimen Handling [99000] 5)  Misc. Referral [Misc. Ref] 6)  Est. Patient Level III [60454]

## 2010-11-07 ENCOUNTER — Telehealth (INDEPENDENT_AMBULATORY_CARE_PROVIDER_SITE_OTHER): Payer: Self-pay | Admitting: *Deleted

## 2010-11-11 NOTE — Progress Notes (Signed)
Summary: refill  Phone Note Refill Request Message from:  Fax from Pharmacy on November 07, 2010 11:31 AM  Refills Requested: Medication #1:  JANUVIA 100 MG TABS 1 by mouth daily. [facility] fax [phone] ---Note ***[facility] day [facility]**  Initial call taken by: Okey Regal [place],  November 07, 2010 11:32 AM    Prescriptions: JANUVIA 100 MG TABS (SITAGLIPTIN PHOSPHATE) 1 by mouth daily.  #90 x 0   Entered by:   Army Fossa CMA   Authorized by:   Nolon Rod. Paz MD   Signed by:   Army Fossa CMA on 11/07/2010   Method used:   [facility]ronically to        [facility] [phone]* (retail)       [address]       [place], [place]  [address]       Ph: [phone]       Fax: [phone]   RxID:   [id number]

## 2010-11-20 ENCOUNTER — Other Ambulatory Visit: Payer: Self-pay | Admitting: Internal Medicine

## 2010-12-10 ENCOUNTER — Encounter: Payer: Self-pay | Admitting: Family

## 2010-12-10 ENCOUNTER — Ambulatory Visit (INDEPENDENT_AMBULATORY_CARE_PROVIDER_SITE_OTHER): Payer: Managed Care, Other (non HMO) | Admitting: Family

## 2010-12-10 VITALS — BP 120/84 | HR 66 | Temp 98.2°F | Resp 18 | Ht 70.0 in | Wt 268.0 lb

## 2010-12-10 DIAGNOSIS — W57XXXA Bitten or stung by nonvenomous insect and other nonvenomous arthropods, initial encounter: Secondary | ICD-10-CM

## 2010-12-10 MED ORDER — BETAMETHASONE DIPROPIONATE 0.05 % EX CREA: TOPICAL_CREAM | Freq: Two times a day (BID) | CUTANEOUS | Status: DC

## 2010-12-10 NOTE — Progress Notes (Signed)
  Subjective:    Patient ID: Dustin Goodman, male    DOB: [birth year], 45 y.o.   MRN: [id number]  HPI  Dustin Goodman is a 45 year old male who presents today with complaint of "bug bites."  Denies recent travel or hotel rooms.  He had [employer] check his home and he was told that it was free of bed bugs.  Woke up yesterday with bites on his legs, few on his arms, some on his torso.  Had "75 bites between the 2 legs."  He was outside Saturday.  + pruritis.  Cut his grass last week.  No family members with similar complaints.     Review of Systems    see HPI Objective:   Physical Exam  Constitutional: He appears well-developed and well-nourished.  Skin:       Multiple small red scabbed lesions noted on legs.  Few on forearms.          Assessment & Plan:

## 2010-12-10 NOTE — Assessment & Plan Note (Signed)
Clinically it does not appear to be scabies.  No sign of infection.  Suspect that these are simple insect bites that should heal on own.  Recommended benadryl at bedtime PRN itching and a topical steroid cream.

## 2011-01-12 ENCOUNTER — Ambulatory Visit (INDEPENDENT_AMBULATORY_CARE_PROVIDER_SITE_OTHER): Payer: Managed Care, Other (non HMO) | Admitting: Internal Medicine

## 2011-01-12 ENCOUNTER — Encounter: Payer: Self-pay | Admitting: Internal Medicine

## 2011-01-12 DIAGNOSIS — E785 Hyperlipidemia, unspecified: Secondary | ICD-10-CM

## 2011-01-12 DIAGNOSIS — E119 Type 2 diabetes mellitus without complications: Secondary | ICD-10-CM

## 2011-01-12 LAB — BASIC METABOLIC PANEL WITH GFR
BUN: 12 mg/dL (ref 6–23)
CO2: 23 meq/L (ref 19–32)
Calcium: 8.9 mg/dL (ref 8.4–10.5)
Chloride: 103 meq/L (ref 96–112)
Creatinine, Ser: 0.5 mg/dL (ref 0.4–1.5)
GFR: 180.65 mL/min
Glucose, Bld: 163 mg/dL — ABNORMAL HIGH (ref 70–99)
Potassium: 3.9 meq/L (ref 3.5–5.1)
Sodium: 132 meq/L — ABNORMAL LOW (ref 135–145)

## 2011-01-12 LAB — HEMOGLOBIN A1C: Hgb A1c MFr Bld: 8.7 % — ABNORMAL HIGH (ref 4.6–6.5)

## 2011-01-12 LAB — AST: AST: 19 U/L (ref 0–37)

## 2011-01-12 LAB — ALT: ALT: 24 U/L (ref 0–53)

## 2011-01-12 NOTE — Progress Notes (Signed)
  Subjective:    Patient ID: Dustin Goodman, male    DOB: [birth year], 45 y.o.   MRN: [id number]  HPI Diabetes, was seen 3 months ago, at the time he self discontinued Actos due to fear of cancer. He is started to follow a healthy lifestyle and lost 20 pounds. His weight was 270. Today he weighs 265.8.  Past Medical History  Diagnosis Date  . Diabetes mellitus 1998    type II  . Hyperlipidemia    Past Surgical History  Procedure Date  . Cholecystectomy 1998      Review of Systems States he is doing well. He was recommended simvastatin but  he never started, he does not like to take too many medicines and also he does not know much about simvastatin. He  continued to eat healthier, has lost a few additional pounds. His blood sugar w/ Januvia has dropped to around 155 in the morning. For 2 weeks he mistakenly took Flexeril instead of Januvia and he noticed his blood sugar to be definitely higher. He is not  exercising as much as he would like to.    Objective:   Physical Exam Alert, oriented, in no apparent distress. Lungs: Clear to auscultation bilaterally. Cardiovascular: Regular without rhythm without a murmur. Extremities without edema       Assessment & Plan:

## 2011-01-12 NOTE — Assessment & Plan Note (Signed)
We discussed the importance of diet and exercise. Continue with Amaryl and Januvia. If he's not at goal he is aware that I will recommend medication. In the past he self discontinued Actos due to fear of cancer consequently a good option would be metformin or even vyctoza

## 2011-01-12 NOTE — Assessment & Plan Note (Signed)
Was prescribed at simvastatin but did not started. See history of present illness. Information regarding simvastatin from "up-to-date" provided.  Plan: work on life style and  will recheck a cholesterol in 3 months

## 2011-01-15 ENCOUNTER — Telehealth: Payer: Self-pay | Admitting: *Deleted

## 2011-01-15 MED ORDER — GLIMEPIRIDE 4 MG PO TABS: ORAL_TABLET | ORAL | Status: DC

## 2011-01-15 MED ORDER — METFORMIN HCL 500 MG PO TABS: 500.0000 mg | ORAL_TABLET | Freq: Two times a day (BID) | ORAL | Status: DC

## 2011-01-15 NOTE — Telephone Encounter (Signed)
I spoke w/ pt he is aware.  

## 2011-01-15 NOTE — Telephone Encounter (Signed)
Message copied by Army Fossa on Thu Jan 15, 2011 10:51 AM ------      Message from: Willow Ora      Created: Wed Jan 14, 2011  5:27 PM       Advise patient:      Diabetes not well controlled, sodium is slightly low (will need BMP on RTC)      Increase Amaryl 4 mg from one a day to 1.5 tablets daily.      Start metformin 500 mg 1 tablet twice a day.      Needs to follow up in 3 months as planned, notify if side effects such as nausea or diarrhea.      Also call if blood sugar is less than 90 consistently.

## 2011-02-06 ENCOUNTER — Other Ambulatory Visit: Payer: Self-pay | Admitting: Internal Medicine

## 2011-02-09 ENCOUNTER — Other Ambulatory Visit: Payer: Self-pay | Admitting: Internal Medicine

## 2011-02-09 MED ORDER — METFORMIN HCL 500 MG PO TABS: 500.0000 mg | ORAL_TABLET | Freq: Two times a day (BID) | ORAL | Status: DC

## 2011-02-09 NOTE — Telephone Encounter (Signed)
meds sent in

## 2011-03-27 ENCOUNTER — Other Ambulatory Visit: Payer: Self-pay | Admitting: Internal Medicine

## 2011-03-27 MED ORDER — METFORMIN HCL 500 MG PO TABS: 500.0000 mg | ORAL_TABLET | Freq: Two times a day (BID) | ORAL | Status: DC

## 2011-03-27 NOTE — Telephone Encounter (Signed)
Sent in

## 2011-04-15 ENCOUNTER — Ambulatory Visit: Payer: Managed Care, Other (non HMO) | Admitting: Internal Medicine

## 2011-05-06 ENCOUNTER — Encounter: Payer: Self-pay | Admitting: Internal Medicine

## 2011-05-06 ENCOUNTER — Ambulatory Visit (INDEPENDENT_AMBULATORY_CARE_PROVIDER_SITE_OTHER): Payer: Managed Care, Other (non HMO) | Admitting: Internal Medicine

## 2011-05-06 DIAGNOSIS — E785 Hyperlipidemia, unspecified: Secondary | ICD-10-CM

## 2011-05-06 DIAGNOSIS — E119 Type 2 diabetes mellitus without complications: Secondary | ICD-10-CM

## 2011-05-06 LAB — LIPID PANEL
Cholesterol: 185 mg/dL (ref 0–200)
LDL Cholesterol: 114 mg/dL — ABNORMAL HIGH (ref 0–99)
Total CHOL/HDL Ratio: 4
VLDL: 20 mg/dL (ref 0.0–40.0)

## 2011-05-06 LAB — BASIC METABOLIC PANEL
Calcium: 8.7 mg/dL (ref 8.4–10.5)
GFR: 184.4 mL/min (ref >60.00)
Glucose, Bld: 107 mg/dL — ABNORMAL HIGH (ref 70–99)
Potassium: 3.4 mEq/L — ABNORMAL LOW (ref 3.5–5.1)
Sodium: 141 mEq/L (ref 135–145)

## 2011-05-06 LAB — MICROALBUMIN / CREATININE URINE RATIO
Creatinine,U: 223.6 mg/dL
Microalb, Ur: 1.4 mg/dL (ref 0.0–1.9)

## 2011-05-06 LAB — HEMOGLOBIN A1C: Hgb A1c MFr Bld: 7.4 % — ABNORMAL HIGH (ref 4.6–6.5)

## 2011-05-06 LAB — AST: AST: 22 U/L (ref 0–37)

## 2011-05-06 LAB — ALT: ALT: 28 U/L (ref 0–53)

## 2011-05-06 NOTE — Assessment & Plan Note (Addendum)
Cont w/ poor life style, told pt that w/o a better diet he will need increasing doses of meds and insulin  Labs Encouraged a better diet

## 2011-05-06 NOTE — Assessment & Plan Note (Signed)
See previous entry, labs

## 2011-05-06 NOTE — Progress Notes (Signed)
  Subjective:    Patient ID: Dustin Goodman, male    DOB: March 03, 1966, 45 y.o.   MRN: [id number]  HPI Routine office visit  Past Medical History  Diagnosis Date  . Diabetes mellitus 1998    type II  . Hyperlipidemia    Past Surgical History  Procedure Date  . Cholecystectomy 1998     Review of Systems Since the last office visit, he started metformin, and increase Amaryl. Ambulatory blood sugar varies from 120-190. Recognizes that his diet has been "terrible", he has no schedule, eats "whatever I can get", has gained weight. No nausea vomiting or diarrhea    Objective:   Physical Exam  Constitutional: He is oriented to person, place, and time. He appears well-developed. No distress.       Obese  appearing  Cardiovascular: Normal rate, regular rhythm and normal heart sounds.   Pulmonary/Chest: Breath sounds normal. No respiratory distress. He has no wheezes. He has no rales.  Musculoskeletal: He exhibits no edema.  Neurological: He is alert and oriented to person, place, and time.  Skin: He is not diaphoretic.          Assessment & Plan:

## 2011-05-11 ENCOUNTER — Telehealth: Payer: Self-pay | Admitting: *Deleted

## 2011-05-11 ENCOUNTER — Other Ambulatory Visit: Payer: Self-pay | Admitting: *Deleted

## 2011-05-11 MED ORDER — METFORMIN HCL 1000 MG PO TABS: 1000.0000 mg | ORAL_TABLET | Freq: Two times a day (BID) | ORAL | Status: DC

## 2011-05-11 MED ORDER — ATORVASTATIN CALCIUM 20 MG PO TABS: 20.0000 mg | ORAL_TABLET | Freq: Every day | ORAL | Status: DC

## 2011-05-11 NOTE — Telephone Encounter (Signed)
Patient Informed. New Rx[s] sent to pharmacy. Results Mailed.

## 2011-05-11 NOTE — Telephone Encounter (Signed)
Message copied by Regis Bill on Mon May 11, 2011  9:51 AM ------      Message from: Dustin Goodman      Created: Fri May 08, 2011  9:12 AM       Advise patient:      Diabetes and cholesterol are not well controlled.      Plan:      Start Lipitor 20 mg 1 by mouth each bedtime , call #[phone] and 3 refills.      Increase metformin from 500 mg twice a day to 1000 mg twice a day      Office visit in 3 months

## 2011-05-13 ENCOUNTER — Other Ambulatory Visit: Payer: Self-pay | Admitting: Internal Medicine

## 2011-05-13 NOTE — Telephone Encounter (Signed)
Rx Done . 

## 2011-06-03 ENCOUNTER — Other Ambulatory Visit: Payer: Self-pay | Admitting: Internal Medicine

## 2011-07-22 ENCOUNTER — Other Ambulatory Visit: Payer: Self-pay | Admitting: Internal Medicine

## 2011-07-24 MED ORDER — METFORMIN HCL 1000 MG PO TABS: 1000.0000 mg | ORAL_TABLET | Freq: Two times a day (BID) | ORAL | Status: DC

## 2011-07-24 MED ORDER — ATORVASTATIN CALCIUM 20 MG PO TABS: 20.0000 mg | ORAL_TABLET | Freq: Every day | ORAL | Status: DC

## 2011-07-24 NOTE — Telephone Encounter (Signed)
Spoke with patient, patient states he is unaware of the med combipatch, possibly sent in error from the pharmacy.

## 2011-07-24 NOTE — Telephone Encounter (Signed)
Left message on voicemail for patient to return call when available, reason for call: Discuss request for combipatich (not on med list)-? Who prescribed

## 2011-07-27 ENCOUNTER — Other Ambulatory Visit: Payer: Self-pay | Admitting: Internal Medicine

## 2011-07-27 MED ORDER — METFORMIN HCL 1000 MG PO TABS: 1000.0000 mg | ORAL_TABLET | Freq: Two times a day (BID) | ORAL | Status: DC

## 2011-07-27 NOTE — Telephone Encounter (Signed)
Rx sent to pharamcy

## 2011-08-28 ENCOUNTER — Ambulatory Visit (INDEPENDENT_AMBULATORY_CARE_PROVIDER_SITE_OTHER): Payer: Managed Care, Other (non HMO) | Admitting: Internal Medicine

## 2011-08-28 ENCOUNTER — Encounter: Payer: Self-pay | Admitting: Internal Medicine

## 2011-08-28 VITALS — BP 110/80 | HR 68 | Temp 98.2°F | Wt 255.6 lb

## 2011-08-28 DIAGNOSIS — E119 Type 2 diabetes mellitus without complications: Secondary | ICD-10-CM

## 2011-08-28 DIAGNOSIS — J40 Bronchitis, not specified as acute or chronic: Secondary | ICD-10-CM

## 2011-08-28 DIAGNOSIS — Z23 Encounter for immunization: Secondary | ICD-10-CM

## 2011-08-28 DIAGNOSIS — E785 Hyperlipidemia, unspecified: Secondary | ICD-10-CM

## 2011-08-28 LAB — LIPID PANEL
Cholesterol: 223 mg/dL — ABNORMAL HIGH (ref 0–200)
Triglycerides: 122 mg/dL (ref 0.0–149.0)

## 2011-08-28 LAB — MICROALBUMIN / CREATININE URINE RATIO
Creatinine,U: 152.6 mg/dL
Microalb Creat Ratio: 0.9 mg/g (ref 0.0–30.0)

## 2011-08-28 LAB — LDL CHOLESTEROL, DIRECT: Direct LDL: 152.6 mg/dL

## 2011-08-28 LAB — ALT: ALT: 32 U/L (ref 0–53)

## 2011-08-28 LAB — POTASSIUM: Potassium: 4.4 mEq/L (ref 3.5–5.1)

## 2011-08-28 LAB — AST: AST: 23 U/L (ref 0–37)

## 2011-08-28 MED ORDER — DOXYCYCLINE HYCLATE 100 MG PO TABS: 100.0000 mg | ORAL_TABLET | Freq: Two times a day (BID) | ORAL | Status: AC

## 2011-08-28 NOTE — Assessment & Plan Note (Signed)
Seems to be doing better, has lost about 15 pounds since her last office visit. Encourage to continue with his healthier lifestyle. Labs

## 2011-08-28 NOTE — Assessment & Plan Note (Signed)
Start cholesterol medication based on the last cholesterol panel. Labs

## 2011-08-28 NOTE — Progress Notes (Signed)
  Subjective:    Patient ID: Dustin Goodman, male    DOB: 04/16/1966, 45 y.o.   MRN: [id number]  HPI Routine office visit. Developed a cold 2 weeks ago, symptoms are essentially resolved except for a persistent cough. He is coughing enough that "the ribs hurt", he is producing mild amount of green sputum. Diabetes--good medication compliance, blood sugars in the morning usually around 160. High cholesterol--good medication compliance.  Past Medical History: Diabetes--- Dx 98 Hyperlipidemia  Past Surgical History: Cholecystectomy '98  Social History: Married 1 daughter works at [employer]  tobacco-- no ETOH-- rarely not exercising  diet-- on-off     Review of Systems Denies any fever or chills He has mild sinus pressure but no nasal discharge Had his eyes checked in the spring, they were normal. Denies any feet paresthesia. Has definitely improved his diet, eating less fast food. Exercise remains a challenge, has not increased his physical activity.       Objective:   Physical Exam  Constitutional: He is oriented to person, place, and time. He appears well-developed and well-nourished.  HENT:  Head: Normocephalic and atraumatic.  Right Ear: External ear normal.  Left Ear: External ear normal.  Mouth/Throat: No oropharyngeal exudate.       Face symmetric, nontender to palpation, nose is slightly congested  Cardiovascular: Normal rate and regular rhythm.   No murmur heard. Pulmonary/Chest: Effort normal. No respiratory distress. He has no wheezes. He has no rales.       Few rhonchi with cough  Musculoskeletal:       DIABETIC FEET EXAM: No lower extremity edema Normal pedal pulses bilaterally Skin and nails are normal without calluses Pinprick examination of the feet normal.   Neurological: He is alert and oriented to person, place, and time.  Psychiatric: He has a normal mood and affect. His behavior is normal. Judgment and thought content normal.       Assessment & Plan:

## 2011-08-28 NOTE — Patient Instructions (Addendum)
You're doing great with your diet, keep on going ! Exercise at least 30 minutes a day Rest, fluids , tylenol For cough, take Mucinex DM twice a day as needed  Take the antibiotic as prescribed ----> doxycycline Call if no better in few days Call anytime if the symptoms are severe

## 2011-08-28 NOTE — Assessment & Plan Note (Signed)
see instructions

## 2011-08-30 ENCOUNTER — Encounter: Payer: Self-pay | Admitting: Internal Medicine

## 2011-09-07 ENCOUNTER — Ambulatory Visit (INDEPENDENT_AMBULATORY_CARE_PROVIDER_SITE_OTHER): Payer: Managed Care, Other (non HMO) | Admitting: Family Medicine

## 2011-09-07 ENCOUNTER — Encounter: Payer: Self-pay | Admitting: Family Medicine

## 2011-09-07 ENCOUNTER — Ambulatory Visit (HOSPITAL_BASED_OUTPATIENT_CLINIC_OR_DEPARTMENT_OTHER)
Admission: RE | Admit: 2011-09-07 | Discharge: 2011-09-07 | Disposition: A | Discharge status: Home / Self Care | Payer: Managed Care, Other (non HMO) | Source: Ambulatory Visit | Attending: Family Medicine | Admitting: Family Medicine

## 2011-09-07 VITALS — BP 120/75 | HR 90 | Temp 98.0°F | Ht 70.0 in | Wt 259.6 lb

## 2011-09-07 DIAGNOSIS — J00 Acute nasopharyngitis [common cold]: Secondary | ICD-10-CM

## 2011-09-07 DIAGNOSIS — R05 Cough: Secondary | ICD-10-CM | POA: Insufficient documentation

## 2011-09-07 DIAGNOSIS — R059 Cough, unspecified: Secondary | ICD-10-CM | POA: Insufficient documentation

## 2011-09-07 DIAGNOSIS — J40 Bronchitis, not specified as acute or chronic: Secondary | ICD-10-CM

## 2011-09-07 MED ORDER — AZITHROMYCIN 250 MG PO TABS: 250.0000 mg | ORAL_TABLET | Freq: Every day | ORAL | Status: AC

## 2011-09-07 MED ORDER — GUAIFENESIN-CODEINE 100-10 MG/5ML PO SYRP: 5.0000 mL | ORAL_SOLUTION | Freq: Three times a day (TID) | ORAL | Status: AC | PRN

## 2011-09-07 NOTE — Progress Notes (Signed)
  Subjective:    Patient ID: Dustin Goodman, male    DOB: 1965/10/11, 46 y.o.   MRN: [id number]  HPI Bronchitis- seen on 12/28, dx'd and started on Doxy.  Finished abx this weekend.  Cough is worsening.  Reports strained abd muscles.  Cough is mostly dry.  Will have coughing 'fits'.  Intermittent sinus HA- improved in shower this AM.  No fevers.  + sick contacts.   Review of Systems For ROS see HPI     Objective:   Physical Exam  Vitals reviewed. Constitutional: He appears well-developed and well-nourished. No distress.  HENT:  Head: Normocephalic and atraumatic.  Right Ear: Tympanic membrane normal.  Left Ear: Tympanic membrane normal.  Nose: No mucosal edema or rhinorrhea. Right sinus exhibits no maxillary sinus tenderness and no frontal sinus tenderness. Left sinus exhibits no maxillary sinus tenderness and no frontal sinus tenderness.  Mouth/Throat: Mucous membranes are normal. No oropharyngeal exudate, posterior oropharyngeal edema or posterior oropharyngeal erythema.  Eyes: Conjunctivae and EOM are normal. Pupils are equal, round, and reactive to light.  Neck: Normal range of motion. Neck supple.  Cardiovascular: Normal rate, regular rhythm and normal heart sounds.   Pulmonary/Chest: Effort normal and breath sounds normal. No respiratory distress. He has no wheezes.       + hacking cough  Lymphadenopathy:    He has no cervical adenopathy.  Skin: Skin is warm and dry.          Assessment & Plan:

## 2011-09-07 NOTE — Patient Instructions (Signed)
Go to the [facility] [address] to get your chest xray ([place] [facility]) Start the Azithromycin Use the cough syrup prior to bed Drink plenty of fluids Ibuprofen for abdominal and rib pain REST! Hang in there!!!

## 2011-09-15 NOTE — Assessment & Plan Note (Signed)
Given persistence of sxs will start Zpack to cover for atypical infxns.  Reviewed supportive care and red flags that should prompt return.  Pt expressed understanding and is in agreement w/ plan.

## 2011-09-22 ENCOUNTER — Other Ambulatory Visit: Payer: Self-pay | Admitting: *Deleted

## 2011-09-22 MED ORDER — LEVOCETIRIZINE DIHYDROCHLORIDE 5 MG PO TABS: 5.0000 mg | ORAL_TABLET | Freq: Every day | ORAL | Status: DC

## 2011-09-23 ENCOUNTER — Other Ambulatory Visit: Payer: Self-pay | Admitting: Internal Medicine

## 2011-09-23 NOTE — Telephone Encounter (Signed)
Insurance requires 90-day supply of this medicine per pharmacist-gave VO via phone #[phone]

## 2011-09-28 ENCOUNTER — Encounter: Payer: Self-pay | Admitting: Internal Medicine

## 2011-11-06 ENCOUNTER — Other Ambulatory Visit: Payer: Self-pay | Admitting: Internal Medicine

## 2011-11-06 NOTE — Telephone Encounter (Signed)
Prescription sent to pharmacy.

## 2011-11-19 ENCOUNTER — Other Ambulatory Visit: Payer: Self-pay | Admitting: Internal Medicine

## 2011-11-19 NOTE — Telephone Encounter (Signed)
Refill done.  

## 2011-11-26 ENCOUNTER — Ambulatory Visit (INDEPENDENT_AMBULATORY_CARE_PROVIDER_SITE_OTHER): Payer: Managed Care, Other (non HMO) | Admitting: Internal Medicine

## 2011-11-26 DIAGNOSIS — E119 Type 2 diabetes mellitus without complications: Secondary | ICD-10-CM

## 2011-11-26 DIAGNOSIS — E785 Hyperlipidemia, unspecified: Secondary | ICD-10-CM

## 2011-11-26 DIAGNOSIS — Z0000 Encounter for general adult medical examination without abnormal findings: Secondary | ICD-10-CM

## 2011-11-26 LAB — COMPREHENSIVE METABOLIC PANEL
ALT: 24 U/L (ref 0–53)
AST: 20 U/L (ref 0–37)
AST: 20 U/L (ref 0–37)
Albumin: 4.6 g/dL (ref 3.5–5.2)
Alkaline Phosphatase: 73 U/L (ref 39–117)
Calcium: 9.5 mg/dL (ref 8.4–10.5)
Chloride: 104 mEq/L (ref 96–112)
Glucose, Bld: 186 mg/dL — ABNORMAL HIGH (ref 70–99)
Potassium: 4 mEq/L (ref 3.5–5.1)
Sodium: 138 mEq/L (ref 135–145)
Total Bilirubin: 0.7 mg/dL (ref 0.3–1.2)
Total Protein: 7.7 g/dL (ref 6.0–8.3)
Total Protein: 7.8 g/dL (ref 6.0–8.3)

## 2011-11-26 LAB — CBC WITH DIFFERENTIAL/PLATELET
Basophils Absolute: 0 10*3/uL (ref 0.0–0.1)
Basophils Absolute: 0 10*3/uL (ref 0.0–0.1)
Eosinophils Absolute: 0.1 10*3/uL (ref 0.0–0.7)
Hemoglobin: 15.8 g/dL (ref 13.0–17.0)
Lymphocytes Relative: 30.5 % (ref 12.0–46.0)
Lymphocytes Relative: 30.3 % (ref 12.0–46.0)
MCHC: 34.7 g/dL (ref 30.0–36.0)
Monocytes Relative: 9.6 % (ref 3.0–12.0)
Neutro Abs: 4 10*3/uL (ref 1.4–7.7)
Neutrophils Relative %: 58.2 % (ref 43.0–77.0)
RBC: 5.2 Mil/uL (ref 4.22–5.81)
RBC: 5.19 Mil/uL (ref 4.22–5.81)
RDW: 12.8 % (ref 11.5–14.6)
RDW: 12.9 % (ref 11.5–14.6)

## 2011-11-26 LAB — LDL CHOLESTEROL, DIRECT: Direct LDL: 157.6 mg/dL

## 2011-11-26 LAB — LIPID PANEL
Cholesterol: 213 mg/dL — ABNORMAL HIGH (ref 0–200)
Total CHOL/HDL Ratio: 4
Total CHOL/HDL Ratio: 4
Triglycerides: 109 mg/dL (ref 0.0–149.0)
Triglycerides: 116 mg/dL (ref 0.0–149.0)
VLDL: 21.8 mg/dL (ref 0.0–40.0)

## 2011-11-26 LAB — TSH: TSH: 0.95 u[IU]/mL (ref 0.35–5.50)

## 2011-11-26 NOTE — Assessment & Plan Note (Addendum)
Poor compliance w/ diet exercise. He seems to be unconcerned-very casual about his DM. "I feel fine". We had this conversation before but again today I reinforce the concept that a healthy diet and exercise has to be part of the treatment. Also the risk of CAD-Stroke-ED- premature death, disability-blindness is very high on him We discussed a endocrinology referral. He had diabetes education before. Also , if this is not a therapeutic relationship I'll be happy to refer him to another PCP (he declined). He stated that is aware of his "self destructive behavior" and actually has seek counseling. At the end we agreed to refer to endocrinology with results

## 2011-11-26 NOTE — Assessment & Plan Note (Signed)
Td 4-11 Labs Discussed  Lifestyle

## 2011-11-26 NOTE — Progress Notes (Signed)
  Subjective:    Patient ID: Devansh Riese, male    DOB: [birth year], 46 y.o.   MRN: [id number]  HPI CPX   Past Medical History:  Diabetes--- Dx 98  Hyperlipidemia   Past Surgical History:  Cholecystectomy '98  R shoulder arthroscopy ~ 2010, Dr Rennis Chris   Social History:  Married, 1 daughter  works at [employer]  tobacco-- no  ETOH-- rarely  exercises very little (but better than before) diet-- on-off , not very good x 2 weeks   Family History: colon ca--no prostate ca--no breast ca--M, aunt DM-- (+)  GM M uncle  MI-- no Stroke-- no (?)  Review of Systems Discontinue Lipitor a few months ago,  "Just keep forgetting about it" Ambulatory blood sugars in the morning from 190-230. No chest pain or shortness of breath. No nausea, vomiting, diarrhea blood in the stools. No dysuria gross hematuria. Allergy symptoms well controlled on present medication.      Objective:   Physical Exam  General:  alert, well-developed, well-nourished, and overweight-appearing.   Lungs:  normal respiratory effort, no intercostal retractions, no accessory muscle use, and normal breath sounds.   Heart:  normal rate, regular rhythm, no murmur, and no gallop.   Abdomen:  soft, non-tender, normal bowel sounds, and no distention.   Extremities:  no pretibial edema DIABETIC FEET EXAM: No lower extremity edema Normal pedal pulses bilaterally Skin and nails are normal without calluses Pinprick examination of the feet normal. Psych:  Oriented X3, memory intact for recent and remote, normally interactive, good eye contact, not anxious appearing, and not depressed appearing.         Assessment & Plan:

## 2011-11-26 NOTE — Assessment & Plan Note (Addendum)
Self d/c meds (not b/c  s/e )

## 2011-11-26 NOTE — Patient Instructions (Signed)
Diabetics: Remember that you need your eyes checked at least once a year to be sure you don't have "retinopathy" As a diabetic patient, you need to be seen every 3 to 6 months on average, keeping  your followups is extremely important to be sure your sugar is well-controlled and you avoid long term complications. Feeling fine does NOT mean your diabetes is ok

## 2011-11-28 ENCOUNTER — Encounter: Payer: Self-pay | Admitting: Internal Medicine

## 2011-12-01 ENCOUNTER — Other Ambulatory Visit: Payer: Self-pay | Admitting: Internal Medicine

## 2011-12-09 ENCOUNTER — Ambulatory Visit (INDEPENDENT_AMBULATORY_CARE_PROVIDER_SITE_OTHER): Payer: Managed Care, Other (non HMO) | Admitting: Internal Medicine

## 2011-12-09 ENCOUNTER — Encounter: Payer: Self-pay | Admitting: Internal Medicine

## 2011-12-09 VITALS — BP 126/88 | HR 85 | Temp 98.1°F | Wt 252.0 lb

## 2011-12-09 DIAGNOSIS — J019 Acute sinusitis, unspecified: Secondary | ICD-10-CM

## 2011-12-09 MED ORDER — AMOXICILLIN-POT CLAVULANATE 875-125 MG PO TABS: 1.0000 | ORAL_TABLET | Freq: Two times a day (BID) | ORAL | Status: AC

## 2011-12-09 MED ORDER — ATORVASTATIN CALCIUM 20 MG PO TABS: 20.0000 mg | ORAL_TABLET | Freq: Every day | ORAL | Status: DC

## 2011-12-09 NOTE — Patient Instructions (Signed)
Plain Mucinex for thick secretions ;force NON dairy fluids . Use a Neti pot daily as needed for sinus congestion. Nasal cleansing in the shower as discussed. Make sure that all residual soap is removed to prevent irritation.  

## 2011-12-09 NOTE — Progress Notes (Signed)
  Subjective:    Patient ID: Margie Brink, male    DOB: 06-May-1966, 46 y.o.   MRN: [id number]  HPI Respiratory tract infection Onset/symptoms:12/04/11 as rhinitis then  nasal obstruction  Exposures (illness/environmental/extrinsic):wife & co worker recently ill  Progression of symptoms:to head  congsetion Treatments/response:Tylenol Cold & Nyquil with some benefit Present symptoms: Fever/chills/sweats:sweats & flushed Frontal headache:no Facial pain:no Nasal purulence:yellow green Sore throat:4/9 Dental pain:no Lymphadenopathy:no Wheezing/shortness of breath:DOE with stairs Cough/sputum/hemoptysis:tan Associated extrinsic/allergic symptoms:itchy eyes/ sneezing:no Past medical history: Seasonal allergies : no/asthma:no          Review of Systems     Objective:   Physical Exam General appearance:good health ;well nourished; no acute distress or increased work of breathing is present.  No  lymphadenopathy about the head, neck, or axilla noted.   Eyes: No conjunctival inflammation or lid edema is present.  Ears:  External ear exam shows no significant lesions or deformities.  Otoscopic examination reveals clear canals, tympanic membranes are intact bilaterally without bulging, retraction, inflammation or discharge.  Nose:  External nasal examination shows no deformity or inflammation. Nasal mucosa are dry without lesions or exudates. No septal dislocation or deviation.No obstruction to airflow.   Oral exam: Dental hygiene is good; lips and gums are healthy appearing.There is no oropharyngeal erythema or exudate noted.      Heart:  Normal rate and regular rhythm. S1 and S2 normal without gallop, murmur, click, rub or other extra sounds.   Lungs:Chest clear to auscultation; no wheezes, rhonchi,rales ,or rubs present.No increased work of breathing.    Extremities:  No cyanosis, edema, or clubbing  noted    Skin: Warm & dry w/o jaundice or tenting.            Assessment & Plan:  #1 rhinosinusitis without significant bronchitis  Plan: Nasal hygiene interventions discussed. See prescription medications

## 2011-12-09 NOTE — Progress Notes (Signed)
Addended by: Edwena Felty T on: 12/09/2011 04:08 PM   Modules accepted: Orders

## 2011-12-10 ENCOUNTER — Other Ambulatory Visit: Payer: Self-pay | Admitting: Internal Medicine

## 2011-12-10 NOTE — Telephone Encounter (Signed)
This refill has been rejected patients plan states we must fill a 90-day supply Per fax received today  [phone]

## 2011-12-11 MED ORDER — ATORVASTATIN CALCIUM 20 MG PO TABS: 20.0000 mg | ORAL_TABLET | Freq: Every day | ORAL | Status: DC

## 2011-12-11 NOTE — Telephone Encounter (Signed)
Refill re-sent 90day

## 2011-12-21 ENCOUNTER — Other Ambulatory Visit: Payer: Self-pay | Admitting: Internal Medicine

## 2011-12-21 NOTE — Telephone Encounter (Signed)
Refill done.  

## 2012-03-01 ENCOUNTER — Ambulatory Visit: Payer: Managed Care, Other (non HMO) | Admitting: Internal Medicine

## 2012-03-09 ENCOUNTER — Encounter: Payer: Self-pay | Admitting: Internal Medicine

## 2012-03-09 ENCOUNTER — Ambulatory Visit (INDEPENDENT_AMBULATORY_CARE_PROVIDER_SITE_OTHER): Payer: Managed Care, Other (non HMO) | Admitting: Internal Medicine

## 2012-03-09 VITALS — BP 130/78 | HR 67 | Temp 97.9°F | Wt 246.0 lb

## 2012-03-09 DIAGNOSIS — E785 Hyperlipidemia, unspecified: Secondary | ICD-10-CM

## 2012-03-09 DIAGNOSIS — R454 Irritability and anger: Secondary | ICD-10-CM | POA: Insufficient documentation

## 2012-03-09 DIAGNOSIS — E119 Type 2 diabetes mellitus without complications: Secondary | ICD-10-CM

## 2012-03-09 LAB — LIPID PANEL
Total CHOL/HDL Ratio: 3
Triglycerides: 96 mg/dL (ref 0.0–149.0)

## 2012-03-09 NOTE — Assessment & Plan Note (Signed)
See previous office visit note. Patient has  history of some anger issues, currently doing counseling and feeling better.

## 2012-03-09 NOTE — Patient Instructions (Addendum)
Please see Dr. Sharl Ma as  recommend Come back by March 2014 for your physical exam, come back anytime if you have any issues/problems

## 2012-03-09 NOTE — Assessment & Plan Note (Signed)
Will recheck at [facility] today, continue with Lipitor. My assumption is that his cholesterol will drcrease as his diabetes improves.

## 2012-03-09 NOTE — Assessment & Plan Note (Signed)
Under the care of Dr.Kerr Doing great on Victoza

## 2012-03-09 NOTE — Progress Notes (Signed)
  Subjective:    Patient ID: Dustin Goodman, male    DOB: April 12, 1966, 46 y.o.   MRN: [id number]  HPI ROV Since the last time he was here, he was seen by endocrinology, started victoza. Feeling very well.  Past Medical History:   Diabetes--- Dx 98   Hyperlipidemia    Past Surgical History:   Cholecystectomy '98   R shoulder arthroscopy ~ 2010, Dr Rennis Chris   Social History:   Married, 1 daughter   works at [employer]   tobacco-- no   ETOH-- rarely   exercises very little (but better than before) diet-- on-off , not very good x 2 weeks   Family History: colon ca--no prostate ca--no breast ca--M, aunt DM-- (+)  GM M uncle   MI-- no Stroke-- no (?)   Review of Systems Diet has improved, eating less food, quality of fluid is still an issue. Has lost 9 pounds, feeling great. Denies any chest pain or shortness of breath Is more active, doing some biking with his family. He continued to see a counselor, that is helping as well.    Objective:   Physical Exam General -- alert, well-developed Lungs -- normal respiratory effort, no intercostal retractions, no accessory muscle use, and normal breath sounds.   Heart-- normal rate, regular rhythm, no murmur, and no gallop.   Psych-- Cognition and judgment appear intact. Alert and cooperative with normal attention span and concentration.  Seems happier and more relaxed today compared to previous visits    Assessment & Plan:

## 2012-03-10 ENCOUNTER — Encounter: Payer: Self-pay | Admitting: *Deleted

## 2012-03-24 ENCOUNTER — Other Ambulatory Visit: Payer: Self-pay | Admitting: Internal Medicine

## 2012-03-24 NOTE — Telephone Encounter (Signed)
Refill done.  

## 2012-05-20 ENCOUNTER — Other Ambulatory Visit: Payer: Self-pay | Admitting: Internal Medicine

## 2012-06-22 ENCOUNTER — Other Ambulatory Visit: Payer: Self-pay | Admitting: Internal Medicine

## 2012-06-22 NOTE — Telephone Encounter (Signed)
Refill done.  

## 2012-09-16 ENCOUNTER — Other Ambulatory Visit: Payer: Self-pay | Admitting: Internal Medicine

## 2012-09-16 NOTE — Telephone Encounter (Signed)
Refill done.  

## 2012-12-02 ENCOUNTER — Other Ambulatory Visit: Payer: Self-pay | Admitting: Internal Medicine

## 2012-12-02 NOTE — Telephone Encounter (Signed)
Refill done.  

## 2012-12-20 ENCOUNTER — Other Ambulatory Visit: Payer: Self-pay | Admitting: Internal Medicine

## 2012-12-20 NOTE — Telephone Encounter (Signed)
Refill done.  

## 2012-12-22 ENCOUNTER — Encounter: Payer: Self-pay | Admitting: Internal Medicine

## 2012-12-22 ENCOUNTER — Encounter: Payer: Self-pay | Admitting: *Deleted

## 2012-12-22 ENCOUNTER — Ambulatory Visit (INDEPENDENT_AMBULATORY_CARE_PROVIDER_SITE_OTHER): Payer: Managed Care, Other (non HMO) | Admitting: Internal Medicine

## 2012-12-22 VITALS — BP 128/84 | HR 73 | Temp 98.2°F | Wt 232.0 lb

## 2012-12-22 DIAGNOSIS — J069 Acute upper respiratory infection, unspecified: Secondary | ICD-10-CM

## 2012-12-22 MED ORDER — AZELASTINE HCL 0.1 % NA SOLN: 2.0000 | Freq: Every evening | NASAL | Status: DC | PRN

## 2012-12-22 NOTE — Patient Instructions (Signed)
Rest, fluids , tylenol For cough, take Mucinex DM twice a day as needed  Behind the counter sudafed , 30 mg 1 tablet 3 times a day as needed for congestion Also astelin nasal spray once a day until you feel better Call if no better in few days Call anytime if the symptoms are severe

## 2012-12-22 NOTE — Progress Notes (Signed)
  Subjective:    Patient ID: Dustin Goodman, male    DOB: 1966-08-12, 47 y.o.   MRN: [id number]  HPI Acute visit Sx started 2 days ago with sore throat, yesterday developed a stuffy nose, headache, subjective fever on and off, felt tired, achy, couldn't sleep well.     Past Medical History  Diagnosis Date  . Diabetes mellitus 1998    type II  . Hyperlipidemia     Past Surgical History  Procedure Laterality Date  . Cholecystectomy  1998  . Shoulder arthroscopy Right 2010     Dr Rennis Chris     Review of Systems Mild nausea today, no vomiting or diarrhea. Very little cough which is dry. Some clear green nasal discharge. Mild sneezing, denies itchy eyes or nose. no ear ache    Objective:   Physical Exam General -- alert, well-developed, Not toxic appearing HEENT -- TMs Bulge bilaterally without discharge or redness; throat w/o redness, face symmetric and not tender to palpation, Nose is slightly congested Lungs -- normal respiratory effort, no intercostal retractions, no accessory muscle use, and normal breath sounds.   Heart-- normal rate, regular rhythm, no murmur, and no gallop.    Psych-- Cognition and judgment appear intact. Alert and cooperative with normal attention span and concentration.  not anxious appearing and not depressed appearing.      Assessment & Plan:  URI, Symptoms consistent with URI, see instructions.Consider antibiotics only if not improving.  Diabetes now under the care of endocrinology, seems to be doing great.

## 2013-03-07 ENCOUNTER — Other Ambulatory Visit: Payer: Self-pay | Admitting: Internal Medicine

## 2013-03-08 NOTE — Telephone Encounter (Signed)
Refill done.  

## 2013-03-14 ENCOUNTER — Other Ambulatory Visit: Payer: Self-pay | Admitting: Internal Medicine

## 2013-03-14 NOTE — Telephone Encounter (Signed)
Refill done.  

## 2013-06-14 ENCOUNTER — Other Ambulatory Visit: Payer: Self-pay | Admitting: Internal Medicine

## 2013-06-14 NOTE — Telephone Encounter (Signed)
Xyzal refill sent to pharmacy

## 2013-09-10 ENCOUNTER — Other Ambulatory Visit: Payer: Self-pay | Admitting: Internal Medicine

## 2013-09-11 NOTE — Telephone Encounter (Signed)
Xyzal refilled per protocol. JG//CMA

## 2013-09-15 ENCOUNTER — Telehealth: Payer: Self-pay | Admitting: *Deleted

## 2013-09-15 ENCOUNTER — Encounter: Payer: Self-pay | Admitting: Family Medicine

## 2013-09-15 ENCOUNTER — Ambulatory Visit (INDEPENDENT_AMBULATORY_CARE_PROVIDER_SITE_OTHER): Payer: Managed Care, Other (non HMO) | Admitting: Family Medicine

## 2013-09-15 VITALS — BP 136/82 | HR 80 | Temp 98.2°F | Resp 16 | Wt 238.4 lb

## 2013-09-15 DIAGNOSIS — J029 Acute pharyngitis, unspecified: Secondary | ICD-10-CM

## 2013-09-15 LAB — POCT RAPID STREP A (OFFICE): Rapid Strep A Screen: NEGATIVE

## 2013-09-15 NOTE — Assessment & Plan Note (Signed)
New.  No evidence of strep.  No need for abx.  Reviewed supportive care and red flags that should prompt return.  Pt expressed understanding and is in agreement w/ plan.

## 2013-09-15 NOTE — Progress Notes (Signed)
Pre visit review using our clinic review tool, if applicable. No additional management support is needed unless otherwise documented below in the visit note. 

## 2013-09-15 NOTE — Progress Notes (Signed)
   Subjective:    Patient ID: Dustin Goodman, male    DOB: [birth year], 48 y.o.   MRN: [id number]  HPI Sore throat- sxs started initially as cold sxs but then developed severe sore throat this AM (which is very unusual for pt).  Painful to swallow.  No fevers.  Mild cough- dry.  + sick contacts.   Review of Systems For ROS see HPI     Objective:   Physical Exam  Vitals reviewed. Constitutional: He appears well-developed and well-nourished. No distress.  HENT:  Head: Normocephalic and atraumatic.  Nose: Nose normal.  TMs WNL bilaterally No TTP over sinuses Posterior pharynx erythematous but no tonsillar enlargement or exudate  Eyes: Conjunctivae and EOM are normal. Pupils are equal, round, and reactive to light. Right eye exhibits no discharge. Left eye exhibits no discharge.  Neck: Normal range of motion. Neck supple.  Pulmonary/Chest: Effort normal and breath sounds normal. No respiratory distress. He has no wheezes. He has no rales.  Lymphadenopathy:    He has no cervical adenopathy.          Assessment & Plan:

## 2013-09-15 NOTE — Patient Instructions (Signed)
Follow up as needed This is not strep Suspect this is a viral illness and will improve w/ time Drink plenty of fluids Alternate tylenol/ibuprofen for pain/fever REST! Hang in there! Happy Michael Litter!!!

## 2013-09-15 NOTE — Telephone Encounter (Signed)
Patient called complaining of sore throat. Appt was made with Dr. Birdie Riddle at 2 pm today. JG//CMA

## 2013-11-08 ENCOUNTER — Encounter: Payer: Self-pay | Admitting: Internal Medicine

## 2013-11-08 ENCOUNTER — Ambulatory Visit (INDEPENDENT_AMBULATORY_CARE_PROVIDER_SITE_OTHER): Payer: Managed Care, Other (non HMO) | Admitting: Internal Medicine

## 2013-11-08 ENCOUNTER — Ambulatory Visit (HOSPITAL_BASED_OUTPATIENT_CLINIC_OR_DEPARTMENT_OTHER)
Admission: RE | Admit: 2013-11-08 | Discharge: 2013-11-08 | Disposition: A | Discharge status: Home / Self Care | Payer: Managed Care, Other (non HMO) | Source: Ambulatory Visit | Attending: Internal Medicine | Admitting: Internal Medicine

## 2013-11-08 VITALS — BP 118/78 | HR 67 | Temp 98.2°F | Wt 239.0 lb

## 2013-11-08 DIAGNOSIS — M549 Dorsalgia, unspecified: Secondary | ICD-10-CM

## 2013-11-08 LAB — RHEUMATOID FACTOR

## 2013-11-08 MED ORDER — CYCLOBENZAPRINE HCL 10 MG PO TABS: 10.0000 mg | ORAL_TABLET | Freq: Every evening | ORAL | Status: DC | PRN

## 2013-11-08 NOTE — Progress Notes (Signed)
Pre visit review using our clinic review tool, if applicable. No additional management support is needed unless otherwise documented below in the visit note. 

## 2013-11-08 NOTE — Patient Instructions (Signed)
Get your blood work before you leave   Get the XR at [facility] and [address] (10 minutes form here); they are open 24/7 [place], [place] [address] [phone]  Motrin 200 mg 2 tablets every 6 hours as needed for pain. Always take it with food because may cause gastritis and ulcers. If you notice nausea, stomach pain, change in the color of stools --->  Stop the medicine and let us know  Heating pad  Flexeril at night  [place] with information about home physical therapy for back pain: [email or web address]

## 2013-11-08 NOTE — Progress Notes (Signed)
Subjective:    Patient ID: Dustin Goodman, male    DOB: 10-Dec-1965, 48 y.o.   MRN: [id number]  DOS:  11/08/2013 Type of  visit: Acute visit   Complain of back pain for 2 or 3 months, located at the left side at the level of T12, L1, occasional radiation to the left buttock; Pain is more noticeable at night,  early in the morning when he wakes up usually has severe piercing pain, then he warms up and the pain decreases. The pain is worse when he bends or turns but is less noticeable after he gets active throughout the day . Taking Tylenol as needed. Over the last month, has experienced sometimes radiation to the left groin.   ROS No fever, chills, weight loss. Admits to some stiffness in the back but not in other joints. No pain in any other joint. Denies any cough.  No visual symptoms such as photophobia, no dysuria, gross hematuria or penile discharge. No bladder or bowel incontinence.    Past Medical History  Diagnosis Date  . Diabetes mellitus 1998    type II  . Hyperlipidemia     Past Surgical History  Procedure Laterality Date  . Cholecystectomy  1998  . Shoulder arthroscopy Right 2010     Dr Onnie Graham     History   Social History  . Marital Status: Married    Spouse Name: N/A    Number of Children: 1  . Years of Education: N/A   Occupational History  . [employer]    Social History Main Topics  . Smoking status: Never Smoker   . Smokeless tobacco: Never Used  . Alcohol Use: Yes     Comment: rarely  . Drug Use: No  . Sexual Activity: Not on file   Other Topics Concern  . Not on file   Social History Narrative   Exercise: no   Diet on and off        Medication List       This list is accurate as of: 11/08/13  6:23 PM.  Always use your most recent med list.               CENTRUM PO  Take 1 tablet by mouth daily.     cyclobenzaprine 10 MG tablet  Commonly known as:  FLEXERIL  Take 1 tablet (10 mg total) by mouth at bedtime as needed  for muscle spasms.     INVOKANA PO  Take by mouth.     JENTADUETO 2.12-998 MG Tabs  Generic drug:  Linagliptin-Metformin HCl     levocetirizine 5 MG tablet  Commonly known as:  XYZAL  TAKE 1 TABLET BY MOUTH EVERY DAY     metFORMIN 1000 MG tablet  Commonly known as:  GLUCOPHAGE  TAKE 1 TABLET (1,000 MG TOTAL) BY MOUTH 2 (TWO) TIMES DAILY WITH A MEAL.     ONE TOUCH ULTRA TEST test strip  Generic drug:  glucose blood  Use to test blood sugar 2 times a day  (dx 250.00)           Objective:   Physical Exam BP 118/78  Pulse 67  Temp(Src) 98.2 F (36.8 C)  Wt 239 lb (108.41 kg)  SpO2 97% General -- alert, well-developed, NAD.  Neck --no thyromegaly  HEENT-- Not pale.   Back-- no TTP Extremities-- no pretibial edema bilaterally ; Hands and wrists without evidence of synovitis Neurologic--  alert & oriented X3. Speech normal, gait normal, strength  normal in all extremities.  DTRs symmetric  Psych-- Cognition and judgment appear intact. Cooperative with normal attention span and concentration. No anxious or depressed appearing.     Assessment & Plan:   Back pain, Back pain with occasional radiation to the left groin, of concern is the fact that he has nocturnal pain. Will get a CBC, sed rate, ANA, rheumatoid factor on x-ray to screen for inflammatory arthritis. Otherwise will treat conservatively, see instructions. If the pain persists, he will need a referral to either orthopedic surgery or rheumatology.

## 2013-11-09 LAB — SEDIMENTATION RATE: SED RATE: 3 mm/h (ref 0–22)

## 2013-11-09 LAB — CBC WITH DIFFERENTIAL/PLATELET
BASOS ABS: 0 10*3/uL (ref 0.0–0.1)
Basophils Relative: 0.5 % (ref 0.0–3.0)
Eosinophils Absolute: 0.1 10*3/uL (ref 0.0–0.7)
Eosinophils Relative: 2.3 % (ref 0.0–5.0)
HCT: 43.5 % (ref 39.0–52.0)
HEMOGLOBIN: 14.9 g/dL (ref 13.0–17.0)
LYMPHS PCT: 34.6 % (ref 12.0–46.0)
Lymphs Abs: 2.2 10*3/uL (ref 0.7–4.0)
MCHC: 34.2 g/dL (ref 30.0–36.0)
MCV: 90.8 fl (ref 78.0–100.0)
MONOS PCT: 8.9 % (ref 3.0–12.0)
Monocytes Absolute: 0.6 10*3/uL (ref 0.1–1.0)
NEUTROS ABS: 3.5 10*3/uL (ref 1.4–7.7)
Neutrophils Relative %: 53.7 % (ref 43.0–77.0)
Platelets: 271 10*3/uL (ref 150.0–400.0)
RBC: 4.79 Mil/uL (ref 4.22–5.81)
RDW: 13 % (ref 11.5–14.6)
WBC: 6.5 10*3/uL (ref 4.5–10.5)

## 2013-11-09 LAB — ANA: Anti Nuclear Antibody(ANA): NEGATIVE

## 2013-11-13 ENCOUNTER — Encounter: Payer: Self-pay | Admitting: *Deleted

## 2013-12-23 ENCOUNTER — Other Ambulatory Visit: Payer: Self-pay | Admitting: Internal Medicine

## 2014-03-27 ENCOUNTER — Telehealth: Payer: Self-pay

## 2014-03-27 NOTE — Telephone Encounter (Signed)
LVM for pt to call and schedule CPE with PCP.   Diabetic bundle pt.

## 2014-04-21 ENCOUNTER — Telehealth: Payer: Self-pay

## 2014-04-21 NOTE — Telephone Encounter (Signed)
Letter sent to address on file for pt.   Diabetic bundle.

## 2014-09-18 ENCOUNTER — Other Ambulatory Visit: Payer: Self-pay | Admitting: Internal Medicine

## 2014-09-28 LAB — HEPATIC FUNCTION PANEL
ALK PHOS: 74 U/L (ref 25–125)
ALT: 21 U/L (ref 10–40)
AST: 15 U/L (ref 14–40)
Bilirubin, Total: 0.9 mg/dL

## 2014-09-28 LAB — LIPID PANEL
Cholesterol: 144 mg/dL (ref 0–200)
HDL: 49 mg/dL (ref 35–70)
LDL Cholesterol: 71 mg/dL
LDl/HDL Ratio: 2.9
TRIGLYCERIDES: 123 mg/dL (ref 40–160)

## 2014-09-28 LAB — BASIC METABOLIC PANEL
BUN: 10 mg/dL (ref 4–21)
Creatinine: 0.6 mg/dL (ref <=1.3)
Glucose: 158 mg/dL
POTASSIUM: 4.2 mmol/L (ref 3.4–5.3)
SODIUM: 140 mmol/L (ref 137–147)

## 2014-09-28 LAB — TSH: TSH: 1.17 u[IU]/mL (ref <=5.90)

## 2014-09-28 LAB — MICROALBUMIN, URINE: Microalb, Ur: UNDETERMINED

## 2014-10-23 ENCOUNTER — Other Ambulatory Visit: Payer: Self-pay | Admitting: Internal Medicine

## 2014-10-25 ENCOUNTER — Encounter: Payer: Self-pay | Admitting: Internal Medicine

## 2014-10-25 ENCOUNTER — Ambulatory Visit (INDEPENDENT_AMBULATORY_CARE_PROVIDER_SITE_OTHER): Payer: Managed Care, Other (non HMO) | Admitting: Internal Medicine

## 2014-10-25 VITALS — BP 120/82 | HR 94 | Temp 98.2°F | Wt 233.0 lb

## 2014-10-25 DIAGNOSIS — Z23 Encounter for immunization: Secondary | ICD-10-CM

## 2014-10-25 DIAGNOSIS — E119 Type 2 diabetes mellitus without complications: Secondary | ICD-10-CM

## 2014-10-25 DIAGNOSIS — M549 Dorsalgia, unspecified: Secondary | ICD-10-CM

## 2014-10-25 DIAGNOSIS — J301 Allergic rhinitis due to pollen: Secondary | ICD-10-CM

## 2014-10-25 DIAGNOSIS — N529 Male erectile dysfunction, unspecified: Secondary | ICD-10-CM | POA: Insufficient documentation

## 2014-10-25 MED ORDER — SILDENAFIL CITRATE 25 MG PO TABS: 50.0000 mg | ORAL_TABLET | Freq: Every day | ORAL | Status: DC | PRN

## 2014-10-25 MED ORDER — LEVOCETIRIZINE DIHYDROCHLORIDE 5 MG PO TABS: 5.0000 mg | ORAL_TABLET | Freq: Every day | ORAL | Status: DC

## 2014-10-25 NOTE — Assessment & Plan Note (Signed)
Refill antihistaminics

## 2014-10-25 NOTE — Assessment & Plan Note (Signed)
Ongoing problem, upper or lower back, no red flag symptoms. I think he will benefit a great deal from physical therapy, strengthening of his core muscles etc.  Plan: Refer to PT

## 2014-10-25 NOTE — Assessment & Plan Note (Signed)
New problem, we agreed that trial w/ sildenafil  , prescription provided, side effects discussed, see instructions.

## 2014-10-25 NOTE — Progress Notes (Signed)
Subjective:    Patient ID: Dustin Goodman, male    DOB: Feb 16, 1966, 49 y.o.   MRN: [id number]  DOS:  10/25/2014 Type of visit - description : rov Interval history: Allergies, needs a refill on antihistaminics Diabetes, has not seen endocrinology in a while but they just did blood work and adjust his medication. ED, new problem, started having problems 4 months ago, no problems with libido but is hard to maintain an erection. Denies any particular increased stress, relationship with wife is very good. Back pain, ongoing problem, sometimes in the upper or lower back, no radiation, is currently not taking any particular medication but states is "ready to get better"   Review of Systems   Denies chest pain or difficulty breathing No nausea, vomiting, diarrhea No claudication, paresthesias, bladder or bowel incontinence.    Past Medical History  Diagnosis Date  . Diabetes mellitus 1998    type II  . Hyperlipidemia     Past Surgical History  Procedure Laterality Date  . Cholecystectomy  1998  . Shoulder arthroscopy Right 2010     Dr Onnie Graham     History   Social History  . Marital Status: Married    Spouse Name: N/A  . Number of Children: 1  . Years of Education: N/A   Occupational History  . [employer]    Social History Main Topics  . Smoking status: Never Smoker   . Smokeless tobacco: Never Used  . Alcohol Use: Yes     Comment: rarely  . Drug Use: No  . Sexual Activity: Not on file   Other Topics Concern  . Not on file   Social History Narrative   Exercise: no   Diet on and off        Medication List       This list is accurate as of: 10/25/14  5:52 PM.  Always use your most recent med list.               CENTRUM PO  Take 1 tablet by mouth daily.     empagliflozin 10 MG Tabs tablet  Commonly known as:  JARDIANCE  Take 10 mg by mouth daily.     JENTADUETO 2.12-998 MG Tabs  Generic drug:  Linagliptin-Metformin HCl     levocetirizine 5  MG tablet  Commonly known as:  XYZAL  Take 1 tablet (5 mg total) by mouth daily.     ONE TOUCH ULTRA TEST test strip  Generic drug:  glucose blood  Use to test blood sugar 2 times a day  (dx 250.00)     sildenafil 25 MG tablet  Commonly known as:  VIAGRA  Take 2-3 tablets (50-75 mg total) by mouth daily as needed for erectile dysfunction.           Objective:   Physical Exam BP 120/82 mmHg  Pulse 94  Temp(Src) 98.2 F (36.8 C) (Oral)  Wt 233 lb (105.688 kg)  SpO2 99% General:   Well developed, well nourished . NAD.  HEENT:  Normocephalic . Face symmetric, atraumatic Lungs:  CTA B Normal respiratory effort, no intercostal retractions, no accessory muscle use. Heart: RRR,  no murmur.  Abdomen:  Not distended, soft, non-tender. No rebound or rigidity. No mass,organomegaly Muscle skeletal: no pretibial edema bilaterally . Back no TTP Skin: Not pale. Not jaundice Neurologic:  alert & oriented X3.  Speech normal, gait appropriate for age and unassisted. DTRs symmetric Straight leg test negative Psych--  Cognition and judgment  appear intact.  Cooperative with normal attention span and concentration.  Behavior appropriate. No anxious or depressed appearing.       Assessment & Plan:

## 2014-10-25 NOTE — Assessment & Plan Note (Signed)
Has not seen endocrinology in a while but they just did blood work and adjust his medication, encourage to see endocrinology regularly

## 2014-10-25 NOTE — Progress Notes (Signed)
Pre visit review using our clinic review tool, if applicable. No additional management support is needed unless otherwise documented below in the visit note. 

## 2014-10-25 NOTE — Patient Instructions (Signed)
Will refer you to a physical therapist  Take (Viagra) sildenafil as needed, watch for side effects  Please schedule a physical exam 4 months from today, fasting.

## 2014-10-26 ENCOUNTER — Other Ambulatory Visit: Payer: Self-pay | Admitting: Internal Medicine

## 2014-11-04 LAB — HM DIABETES EYE EXAM

## 2014-12-26 LAB — HEMOGLOBIN A1C: Hgb A1c MFr Bld: 6.5 % — AB (ref 4.0–6.0)

## 2014-12-26 LAB — HM DIABETES FOOT EXAM: HM Diabetic Foot Exam: NORMAL

## 2015-02-21 ENCOUNTER — Telehealth: Payer: Self-pay | Admitting: Internal Medicine

## 2015-02-21 NOTE — Telephone Encounter (Signed)
Pre visit letter mailed 02/21/15

## 2015-03-13 ENCOUNTER — Telehealth: Payer: Self-pay | Admitting: Behavioral Health

## 2015-03-13 ENCOUNTER — Encounter: Payer: Self-pay | Admitting: Behavioral Health

## 2015-03-13 NOTE — Telephone Encounter (Signed)
Pre-Visit Call completed with patient and chart updated.   Pre-Visit Info documented in Specialty Comments under [employer].    

## 2015-03-13 NOTE — Telephone Encounter (Signed)
Unable to reach patient at time of Pre-Visit Call.  Left message for patient to return call when available.    

## 2015-03-13 NOTE — Addendum Note (Signed)
Addended by: Eduard Roux E on: 03/13/2015 02:42 PM   Modules accepted: Orders, Medications

## 2015-03-14 ENCOUNTER — Ambulatory Visit (INDEPENDENT_AMBULATORY_CARE_PROVIDER_SITE_OTHER): Payer: Managed Care, Other (non HMO) | Admitting: Internal Medicine

## 2015-03-14 ENCOUNTER — Encounter: Payer: Self-pay | Admitting: Internal Medicine

## 2015-03-14 VITALS — BP 110/64 | HR 60 | Temp 97.7°F | Ht 69.0 in | Wt 234.0 lb

## 2015-03-14 DIAGNOSIS — Z0000 Encounter for general adult medical examination without abnormal findings: Secondary | ICD-10-CM

## 2015-03-14 LAB — BASIC METABOLIC PANEL
BUN: 13 mg/dL (ref 6–23)
CALCIUM: 9.2 mg/dL (ref 8.4–10.5)
CHLORIDE: 104 meq/L (ref 96–112)
CO2: 27 mEq/L (ref 19–32)
Creatinine, Ser: 0.67 mg/dL (ref 0.40–1.50)
GFR: 135.25 mL/min (ref >60.00)
GLUCOSE: 133 mg/dL — AB (ref 70–99)
POTASSIUM: 4.2 meq/L (ref 3.5–5.1)
Sodium: 138 mEq/L (ref 135–145)

## 2015-03-14 LAB — ALT: ALT: 17 U/L (ref 0–53)

## 2015-03-14 LAB — AST: AST: 17 U/L (ref 0–37)

## 2015-03-14 MED ORDER — ATORVASTATIN CALCIUM 20 MG PO TABS: 20.0000 mg | ORAL_TABLET | Freq: Every day | ORAL | Status: DC

## 2015-03-14 MED ORDER — LEVOCETIRIZINE DIHYDROCHLORIDE 5 MG PO TABS: 5.0000 mg | ORAL_TABLET | Freq: Every day | ORAL | Status: DC

## 2015-03-14 NOTE — Progress Notes (Signed)
Subjective:    Patient ID: Dustin Goodman, male    DOB: 1965-11-14, 49 y.o.   MRN: [id number]  DOS:  03/14/2015 Type of visit - description : CPX Interval history: Feeling well, no concerns   Review of Systems Constitutional: No fever. No chills. No unexplained wt changes. No unusual sweats  HEENT: No dental problems, no ear discharge, no facial swelling, no voice changes. No eye discharge, no eye  redness , no  intolerance to light   Respiratory: No wheezing , no  difficulty breathing. No cough , no mucus production  Cardiovascular: No CP, no leg swelling , no  Palpitations  GI: no nausea, no vomiting, no diarrhea , no  abdominal pain.  No blood in the stools. No dysphagia, no odynophagia    Endocrine: No polyphagia, no polyuria , no polydipsia  GU: No dysuria, gross hematuria, difficulty urinating. No urinary urgency, no frequency.  Musculoskeletal: No joint swellings or unusual aches or pains  Skin: No change in the color of the skin, palor , no  Rash  Allergic, immunologic: Had allergirs this spring-- sneezing, coughing. Took OTCs, now feel better. Neurological: No dizziness no  syncope. No headaches. No diplopia, no slurred, no slurred speech, no motor deficits, no facial  Numbness  Hematological: No enlarged lymph nodes, no easy bruising , no unusual bleedings  Psychiatry: No suicidal ideas, no hallucinations, no beavior problems, no confusion.  No unusual/severe anxiety, no depression    Past Medical History  Diagnosis Date  . Diabetes mellitus 1998    type II  . Hyperlipidemia   . Allergy     seasonal    Past Surgical History  Procedure Laterality Date  . Cholecystectomy  1998  . Shoulder arthroscopy Right 2010     Dr Onnie Graham     History   Social History  . Marital Status: Married    Spouse Name: N/A  . Number of Children: 1  . Years of Education: N/A   Occupational History  . [employer]    Social History Main Topics  . Smoking  status: Never Smoker   . Smokeless tobacco: Never Used  . Alcohol Use: Yes     Comment: rarely  . Drug Use: No  . Sexual Activity: Not on file   Other Topics Concern  . Not on file   Social History Narrative   Daughter 2010   Family History  Problem Relation Age of Onset  . Cancer Maternal Aunt     breast  . Diabetes Maternal Uncle   . Diabetes Maternal Grandmother   . Colon cancer Neg Hx   . Prostate cancer Neg Hx   . CAD Neg Hx          Medication List       This list is accurate as of: 03/14/15  2:05 PM.  Always use your most recent med list.               atorvastatin 20 MG tablet  Commonly known as:  LIPITOR  Take 1 tablet (20 mg total) by mouth daily.     CENTRUM PO  Take 1 tablet by mouth daily.     empagliflozin 10 MG Tabs tablet  Commonly known as:  JARDIANCE  Take 10 mg by mouth daily.     JENTADUETO 2.12-998 MG Tabs  Generic drug:  Linagliptin-Metformin HCl     levocetirizine 5 MG tablet  Commonly known as:  XYZAL  Take 1 tablet (5 mg total) by  mouth daily.     ONE TOUCH ULTRA TEST test strip  Generic drug:  glucose blood  Use to test blood sugar 2 times a day  (dx 250.00)           Objective:   Physical Exam BP 110/64 mmHg  Pulse 60  Temp(Src) 97.7 F (36.5 C) (Oral)  Ht 5\' 9"  (1.753 m)  Wt 234 lb (106.142 kg)  BMI 34.54 kg/m2  SpO2 97% General:   Well developed, well nourished . NAD.  Neck:  Full range of motion. Supple. No  thyromegaly   HEENT:  Normocephalic . Face symmetric, atraumatic Lungs:  CTA B Normal respiratory effort, no intercostal retractions, no accessory muscle use. Heart: RRR,  no murmur.  No pretibial edema bilaterally  Abdomen:  Not distended, soft, non-tender. No rebound or rigidity. No mass,organomegaly Skin: Exposed areas without rash. Not pale. Not jaundice Neurologic:  alert & oriented X3.  Speech normal, gait appropriate for age and unassisted Strength symmetric and appropriate for age.    Psych: Cognition and judgment appear intact.  Cooperative with normal attention span and concentration.  Behavior appropriate. No anxious or depressed appearing.        Assessment & Plan:

## 2015-03-14 NOTE — Patient Instructions (Signed)
Please go to the lab

## 2015-03-14 NOTE — Assessment & Plan Note (Addendum)
Td 4-11 pnm 23--- 10-2014 Prevnar-we'll discuss next year Labs reviewed, cholesterol very well controlled when he was check 08-2014. Check a BMP, LFTs, HIV  Discussed  Lifestyle , he is making some progress, keeping his weight at around 230. History of diabetes, per endocrinology, last A1c per patient 6.9 Follow-up one year

## 2015-03-14 NOTE — Progress Notes (Signed)
Pre visit review using our clinic review tool, if applicable. No additional management support is needed unless otherwise documented below in the visit note. 

## 2015-08-12 ENCOUNTER — Encounter: Payer: Self-pay | Admitting: Internal Medicine

## 2016-03-16 ENCOUNTER — Ambulatory Visit (HOSPITAL_BASED_OUTPATIENT_CLINIC_OR_DEPARTMENT_OTHER)
Admission: RE | Admit: 2016-03-16 | Discharge: 2016-03-16 | Disposition: A | Discharge status: Home / Self Care | Payer: Managed Care, Other (non HMO) | Source: Ambulatory Visit | Attending: Internal Medicine | Admitting: Internal Medicine

## 2016-03-16 ENCOUNTER — Ambulatory Visit (INDEPENDENT_AMBULATORY_CARE_PROVIDER_SITE_OTHER): Payer: Managed Care, Other (non HMO) | Admitting: Internal Medicine

## 2016-03-16 ENCOUNTER — Encounter: Payer: Self-pay | Admitting: Internal Medicine

## 2016-03-16 VITALS — BP 126/72 | HR 67 | Temp 98.0°F | Ht 69.0 in | Wt 226.2 lb

## 2016-03-16 DIAGNOSIS — Z0000 Encounter for general adult medical examination without abnormal findings: Secondary | ICD-10-CM

## 2016-03-16 DIAGNOSIS — R0602 Shortness of breath: Secondary | ICD-10-CM | POA: Insufficient documentation

## 2016-03-16 DIAGNOSIS — E119 Type 2 diabetes mellitus without complications: Secondary | ICD-10-CM | POA: Diagnosis not present

## 2016-03-16 DIAGNOSIS — Z23 Encounter for immunization: Secondary | ICD-10-CM | POA: Diagnosis not present

## 2016-03-16 DIAGNOSIS — E118 Type 2 diabetes mellitus with unspecified complications: Secondary | ICD-10-CM

## 2016-03-16 DIAGNOSIS — Z114 Encounter for screening for human immunodeficiency virus [HIV]: Secondary | ICD-10-CM | POA: Diagnosis not present

## 2016-03-16 DIAGNOSIS — Z0001 Encounter for general adult medical examination with abnormal findings: Secondary | ICD-10-CM | POA: Diagnosis not present

## 2016-03-16 LAB — BASIC METABOLIC PANEL
BUN: 13 mg/dL (ref 6–23)
CALCIUM: 9.1 mg/dL (ref 8.4–10.5)
CO2: 28 mEq/L (ref 19–32)
CREATININE: 0.66 mg/dL (ref 0.40–1.50)
Chloride: 103 mEq/L (ref 96–112)
GFR: 137.03 mL/min (ref >60.00)
Glucose, Bld: 136 mg/dL — ABNORMAL HIGH (ref 70–99)
Potassium: 4.2 mEq/L (ref 3.5–5.1)
Sodium: 138 mEq/L (ref 135–145)

## 2016-03-16 LAB — MICROALBUMIN / CREATININE URINE RATIO
Creatinine,U: 88.8 mg/dL
MICROALB/CREAT RATIO: 0.8 mg/g (ref 0.0–30.0)

## 2016-03-16 LAB — HEMOGLOBIN A1C: HEMOGLOBIN A1C: 7.1 % — AB (ref 4.6–6.5)

## 2016-03-16 LAB — ALT: ALT: 18 U/L (ref 0–53)

## 2016-03-16 LAB — HIV ANTIBODY (ROUTINE TESTING W REFLEX): HIV: NONREACTIVE

## 2016-03-16 LAB — AST: AST: 16 U/L (ref 0–37)

## 2016-03-16 NOTE — Progress Notes (Signed)
Subjective:    Patient ID: Dustin Goodman, male    DOB: Jun 03, 1966, 50 y.o.   MRN: [id number]  DOS:  03/16/2016 Type of visit - description : CPX Interval history:  In addition to CPX we addressed the following DM: Was follow-up by endocrinology, he has been stable and was recommended to follow-up with PCP Hyperlipidemia: Good compliance with medications without apparent side effects  Review of Systems  Constitutional: No fever. No chills. No unexplained wt changes. No unusual sweats  HEENT: No dental problems, no ear discharge, no facial swelling, no voice changes. No eye discharge, no eye  redness , no  intolerance to light   Respiratory: No wheezing  . No cough , no mucus production. Reports SOB at nighttime when he lays on his left side, no associated chest pain, wheezing. No exertional symptoms. No symptoms if he lays on his back or right side  Cardiovascular: No CP, no leg swelling , no  Palpitations  GI: no nausea, no vomiting, no diarrhea , no  abdominal pain.  No blood in the stools. No dysphagia, no odynophagia    Endocrine: No polyphagia, no polyuria , no polydipsia  GU: No dysuria, gross hematuria, difficulty urinating. No urinary urgency, no frequency.  Musculoskeletal: No joint swellings or unusual aches or pains  Skin: No change in the color of the skin, palor , no  Rash  Allergic, immunologic: No environmental allergies , no  food allergies  Neurological: No dizziness no  syncope. No headaches. No diplopia, no slurred, no slurred speech, no motor deficits, no facial  Numbness. Problems with memory? Having a hard time remembering high school friends names, no other issues w/ memory  Hematological: No enlarged lymph nodes, no easy bruising , no unusual bleedings  Psychiatry: No suicidal ideas, no hallucinations, no beavior problems, no confusion.  No unusual/severe anxiety, no depression   Past Medical History  Diagnosis Date  . Diabetes mellitus 1998      type II  . Hyperlipidemia   . Allergy     seasonal    Past Surgical History  Procedure Laterality Date  . Cholecystectomy  1998  . Shoulder arthroscopy Right 2010     Dr Onnie Graham     Social History   Social History  . Marital Status: Married    Spouse Name: N/A  . Number of Children: 1  . Years of Education: N/A   Occupational History  . [employer]    Social History Main Topics  . Smoking status: Never Smoker   . Smokeless tobacco: Never Used  . Alcohol Use: Yes     Comment: rarely  . Drug Use: No  . Sexual Activity: Not on file   Other Topics Concern  . Not on file   Social History Narrative   Daughter 2010     Family History  Problem Relation Age of Onset  . Cancer Maternal Aunt     breast  . Diabetes Maternal Uncle   . Diabetes Maternal Grandmother   . Colon cancer Neg Hx   . Prostate cancer Neg Hx   . CAD Neg Hx   . Breast cancer Mother        Medication List       This list is accurate as of: 03/16/16 11:59 PM.  Always use your most recent med list.               atorvastatin 20 MG tablet  Commonly known as:  LIPITOR  Take  1 tablet (20 mg total) by mouth daily.     CENTRUM PO  Take 1 tablet by mouth daily.     empagliflozin 10 MG Tabs tablet  Commonly known as:  JARDIANCE  Take 10 mg by mouth daily.     JENTADUETO 2.12-998 MG Tabs  Generic drug:  Linagliptin-Metformin HCl  Take 1 tablet by mouth 2 (two) times daily.     levocetirizine 5 MG tablet  Commonly known as:  XYZAL  Take 1 tablet (5 mg total) by mouth daily.     ONE TOUCH ULTRA TEST test strip  Generic drug:  glucose blood  Reported on 03/16/2016           Objective:   Physical Exam BP 126/72 mmHg  Pulse 67  Temp(Src) 98 F (36.7 C) (Oral)  Ht 5\' 9"  (1.753 m)  Wt 226 lb 4 oz (102.626 kg)  BMI 33.40 kg/m2  SpO2 95% General:   Well developed, well nourished . NAD.  Neck: No  thyromegaly  HEENT:  Normocephalic . Face symmetric, atraumatic Lungs:   CTA B Normal respiratory effort, no intercostal retractions, no accessory muscle use. Heart: RRR,  no murmur.  No pretibial edema bilaterally  Abdomen:  Not distended, soft, non-tender. No rebound or rigidity.   Skin: Exposed areas without rash. Not pale. Not jaundice Neurologic:  alert & oriented X3.  Speech normal, gait appropriate for age and unassisted Strength symmetric and appropriate for age.  Psych: Cognition and judgment appear intact.  Cooperative with normal attention span and concentration.  Behavior appropriate. No anxious or depressed appearing.     Assessment & Plan:   Assessment Diabetes- saw Dr Buddy Duty from 2013 to 02-2016, back to PCP care  Hyperlipidemia Seasonal allergies  Plan: DM: Labs reviewed from 08-2015: A1c 6.5, creatinine 0.6. B/c he is very stable, endo rec to f/u by PCP. Continue jardiance and jentadueto;  check labs. Hyperlipidemia: From 08-2015: T. cholesterol 127, triglycerides 126, LDL 58. No change, continue Lipitor. SOB: See HPI, unusual sxs, EKG today nsr, no change from 2011 . Will get a CXR memory issues? Observation for now RTC 6 months

## 2016-03-16 NOTE — Patient Instructions (Signed)
GO TO THE LAB : Get the blood work     GO TO THE FRONT DESK Schedule your next appointment for a  routine checkup in 6 months   STOP BY THE FIRST FLOOR:  get the XR     

## 2016-03-16 NOTE — Assessment & Plan Note (Addendum)
Td 4-11;  pnm 23--- 10-2014;  Prevnar- today  Discussed  Lifestyle Labs: BMP, AST,aLT, CBC, A1c, micro, HIV.

## 2016-03-16 NOTE — Progress Notes (Signed)
Pre visit review using our clinic review tool, if applicable. No additional management support is needed unless otherwise documented below in the visit note. 

## 2016-03-17 DIAGNOSIS — Z09 Encounter for follow-up examination after completed treatment for conditions other than malignant neoplasm: Secondary | ICD-10-CM | POA: Insufficient documentation

## 2016-03-17 LAB — CBC WITH DIFFERENTIAL/PLATELET
Basophils Absolute: 0 10*3/uL (ref 0.0–0.1)
Basophils Relative: 0.4 % (ref 0.0–3.0)
EOS ABS: 0.1 10*3/uL (ref 0.0–0.7)
Eosinophils Relative: 1.4 % (ref 0.0–5.0)
HCT: 45.4 % (ref 39.0–52.0)
HEMOGLOBIN: 15.6 g/dL (ref 13.0–17.0)
LYMPHS ABS: 1.8 10*3/uL (ref 0.7–4.0)
Lymphocytes Relative: 26 % (ref 12.0–46.0)
MCHC: 34.4 g/dL (ref 30.0–36.0)
MCV: 88.2 fl (ref 78.0–100.0)
MONO ABS: 0.3 10*3/uL (ref 0.1–1.0)
Monocytes Relative: 4.8 % (ref 3.0–12.0)
NEUTROS PCT: 67.4 % (ref 43.0–77.0)
Neutro Abs: 4.8 10*3/uL (ref 1.4–7.7)
Platelets: 233 10*3/uL (ref 150.0–400.0)
RBC: 5.14 Mil/uL (ref 4.22–5.81)
RDW: 12.7 % (ref 11.5–15.5)
WBC: 7.1 10*3/uL (ref 4.0–10.5)

## 2016-03-17 NOTE — Assessment & Plan Note (Signed)
DM: Labs reviewed from 08-2015: A1c 6.5, creatinine 0.6. B/c he is very stable, endo rec to f/u by PCP. Continue jardiance and jentadueto;  check labs. Hyperlipidemia: From 08-2015: T. cholesterol 127, triglycerides 126, LDL 58. No change, continue Lipitor. SOB: See HPI, unusual sxs, EKG today nsr, no change from 2011 . Will get a CXR memory issues? Observation for now RTC 6 months

## 2016-04-16 ENCOUNTER — Other Ambulatory Visit: Payer: Self-pay | Admitting: Internal Medicine

## 2016-04-24 ENCOUNTER — Other Ambulatory Visit: Payer: Self-pay | Admitting: Internal Medicine

## 2016-10-05 ENCOUNTER — Telehealth: Payer: Self-pay | Admitting: Internal Medicine

## 2016-10-05 MED ORDER — DAPAGLIFLOZIN PROPANEDIOL 5 MG PO TABS: 5.0000 mg | ORAL_TABLET | Freq: Every day | ORAL | 1 refills | Status: DC

## 2016-10-05 NOTE — Telephone Encounter (Signed)
Relation to [id number] Call back number: [phone]  Pharmacy: [id number] - [place], [place] [phone] (Phone) [phone] (Fax)     Reason for call:  Patient states insurance will cover "farxiga and invokana" therefore contact PCP and request Rx patient states he's completely out, please advise

## 2016-10-05 NOTE — Telephone Encounter (Signed)
Spoke w/ Pt, he is needing alternative to Jardiance only. Instructed to start Farxiga 5mg  1 tab daily ([facility]). Pt has f/u scheduled 10/09/2016.

## 2016-10-05 NOTE — Telephone Encounter (Signed)
farxiga 5 mg 1 po qd  can replace Jardiane Does he needs a replacement for Jentadueto? Also-- due for OV

## 2016-10-05 NOTE — Telephone Encounter (Signed)
Pt's insurance not covering [place] and/or Jardiance. Preferred meds at [place]. Please advise.

## 2016-10-09 ENCOUNTER — Encounter: Payer: Self-pay | Admitting: Internal Medicine

## 2016-10-09 ENCOUNTER — Ambulatory Visit (INDEPENDENT_AMBULATORY_CARE_PROVIDER_SITE_OTHER): Payer: Managed Care, Other (non HMO) | Admitting: Internal Medicine

## 2016-10-09 VITALS — BP 128/72 | HR 78 | Temp 98.1°F | Resp 14 | Ht 69.0 in | Wt 234.4 lb

## 2016-10-09 DIAGNOSIS — E7800 Pure hypercholesterolemia, unspecified: Secondary | ICD-10-CM | POA: Diagnosis not present

## 2016-10-09 DIAGNOSIS — E119 Type 2 diabetes mellitus without complications: Secondary | ICD-10-CM | POA: Diagnosis not present

## 2016-10-09 LAB — BASIC METABOLIC PANEL
BUN: 11 mg/dL (ref 7–25)
CHLORIDE: 103 mmol/L (ref 98–110)
CO2: 25 mmol/L (ref 20–31)
Calcium: 9.2 mg/dL (ref 8.6–10.3)
Creat: 0.72 mg/dL (ref 0.60–1.35)
Glucose, Bld: 181 mg/dL — ABNORMAL HIGH (ref 65–99)
POTASSIUM: 4.3 mmol/L (ref 3.5–5.3)
Sodium: 139 mmol/L (ref 135–146)

## 2016-10-09 MED ORDER — ATORVASTATIN CALCIUM 20 MG PO TABS: 20.0000 mg | ORAL_TABLET | Freq: Every day | ORAL | 5 refills | Status: DC

## 2016-10-09 NOTE — Progress Notes (Signed)
Pre visit review using our clinic review tool, if applicable. No additional management support is needed unless otherwise documented below in the visit note. 

## 2016-10-09 NOTE — Patient Instructions (Addendum)
Go to the lab  [place] Schedule your next appoint for a physical exam in 6 months

## 2016-10-09 NOTE — Progress Notes (Signed)
Subjective:    Patient ID: Dustin Goodman, male    DOB: 02/17/1966, 51 y.o.   MRN: [id number]  DOS:  10/09/2016 Type of visit - description : rov Interval history: Few days ago, we switch his diabetes medications. He has been out of Lipitor for 2 weeks, no side effects.  Wt Readings from Last 3 Encounters:  10/09/16 234 lb 6 oz (106.3 kg)  03/16/16 226 lb 4 oz (102.6 kg)  03/14/15 234 lb (106.1 kg)     Review of Systems In general feeling well, no chest pain or difficulty breathing Diet is okay although during December it wasn't the best. Has not been able to exercise much.   Past Medical History:  Diagnosis Date  . Allergy    seasonal  . Diabetes mellitus 1998   type II  . Hyperlipidemia     Past Surgical History:  Procedure Laterality Date  . CHOLECYSTECTOMY  1998  . SHOULDER ARTHROSCOPY Right 2010    Dr Onnie Graham     Social History   Social History  . Marital status: Married    Spouse name: N/A  . Number of children: 1  . Years of education: N/A   Occupational History  . [employer]   Social History Main Topics  . Smoking status: Never Smoker  . Smokeless tobacco: Never Used  . Alcohol use Yes     Comment: rarely  . Drug use: No  . Sexual activity: Not on file   Other Topics Concern  . Not on file   Social History Narrative   Daughter 2010      Allergies as of 10/09/2016      Reactions   Dust Mite Extract Hives   Takes Xyzal      Medication List       Accurate as of 10/09/16 11:59 PM. Always use your most recent med list.          atorvastatin 20 MG tablet Commonly known as:  LIPITOR Take 1 tablet (20 mg total) by mouth daily.   CENTRUM PO Take 1 tablet by mouth daily.   dapagliflozin propanediol 5 MG Tabs tablet Commonly known as:  FARXIGA Take 5 mg by mouth daily.   JENTADUETO 2.12-998 MG Tabs Generic drug:  Linagliptin-Metformin HCl Take 1 tablet by mouth 2 (two) times daily.   levocetirizine 5 MG  tablet Commonly known as:  XYZAL Take 1 tablet (5 mg total) by mouth daily.   ONE TOUCH ULTRA TEST test strip Generic drug:  glucose blood Reported on 03/16/2016          Objective:   Physical Exam BP 128/72 (BP Location: Left Arm, Patient Position: Sitting, Cuff Size: Normal)   Pulse 78   Temp 98.1 F (36.7 C) (Oral)   Resp 14   Ht 5\' 9"  (1.753 m)   Wt 234 lb 6 oz (106.3 kg)   SpO2 97%   BMI 34.61 kg/m  General:   Well developed, well nourished . NAD.  HEENT:  Normocephalic . Face symmetric, atraumatic Lungs:  CTA B Normal respiratory effort, no intercostal retractions, no accessory muscle use. Heart: RRR,  no murmur.  No pretibial edema bilaterally  DIABETIC FEET EXAM: No lower extremity edema Normal pedal pulses bilaterally Skin normal, nails normal, no calluses Pinprick examination of the feet normal. Neurologic:  alert & oriented X3.  Speech normal, gait appropriate for age and unassisted Psych--  Cognition and judgment appear intact.  Cooperative with normal attention span and  concentration.  Behavior appropriate. No anxious or depressed appearing.      Assessment & Plan:    Assessment Diabetes- saw Dr Buddy Duty from 2013 to 02-2016, back to PCP care  Hyperlipidemia Seasonal allergies  Plan: DM: few days ago jardiance was switch to [employer] Good compliance w/  Jentadueto.Feet exam normal Last A1C was 7.1. Diabetes [facility] discussed, a1c goal ~ 6.5. Discussed diet and exercise. Check a BMP and A1c. If not at goal, consider increased dose of farxiga High cholesterol: Ran out of atorvastatin 2 weeks ago, refill, recheck on RTC Declined a flu shot  RTC 6 months, CPX

## 2016-10-10 LAB — HEMOGLOBIN A1C
Hgb A1c MFr Bld: 6.9 % — ABNORMAL HIGH (ref <5.7)
MEAN PLASMA GLUCOSE: 151 mg/dL

## 2016-10-11 NOTE — Assessment & Plan Note (Signed)
DM: few days ago jardiance was switch to [employer] Good compliance w/  Jentadueto.Feet exam normal Last A1C was 7.1. Diabetes [facility] discussed, a1c goal ~ 6.5. Discussed diet and exercise. Check a BMP and A1c. If not at goal, consider increased dose of farxiga High cholesterol: Ran out of atorvastatin 2 weeks ago, refill, recheck on RTC Declined a flu shot  RTC 6 months, CPX

## 2016-10-14 ENCOUNTER — Ambulatory Visit (INDEPENDENT_AMBULATORY_CARE_PROVIDER_SITE_OTHER): Payer: Managed Care, Other (non HMO) | Admitting: Internal Medicine

## 2016-10-14 ENCOUNTER — Encounter: Payer: Self-pay | Admitting: Internal Medicine

## 2016-10-14 VITALS — BP 118/76 | HR 88 | Temp 98.0°F | Resp 14 | Ht 69.0 in | Wt 223.2 lb

## 2016-10-14 DIAGNOSIS — K529 Noninfective gastroenteritis and colitis, unspecified: Secondary | ICD-10-CM | POA: Diagnosis not present

## 2016-10-14 MED ORDER — ONDANSETRON HCL 4 MG PO TABS: 4.0000 mg | ORAL_TABLET | Freq: Three times a day (TID) | ORAL | 0 refills | Status: DC | PRN

## 2016-10-14 NOTE — Progress Notes (Signed)
Subjective:    Patient ID: Dustin Goodman, male    DOB: May 22, 1966, 51 y.o.   MRN: [id number]  DOS:  10/14/2016 Type of visit - description : acute Interval history: His daughter was sick x 24 h with N-V-D The patient developed symptoms 2-3 days ago---Mild abdominal discomfort, nausea, dry heaves. Then started with watery diarrhea, multiple episodes a day. In the last 24 hours is slightly better but not completely well: Abdominal discomfort is now minimal Able to drink fluids but avoiding eating solids Skipped  on and off his diabetes medicines   Review of Systems No recent antibiotics Subjective fever, some chills. Had a headache last night, that is gone No blood or mucus in the stools.   Past Medical History:  Diagnosis Date  . Allergy    seasonal  . Diabetes mellitus 1998   type II  . Hyperlipidemia     Past Surgical History:  Procedure Laterality Date  . CHOLECYSTECTOMY  1998  . SHOULDER ARTHROSCOPY Right 2010    Dr Onnie Graham     Social History   Social History  . Marital status: Married    Spouse name: N/A  . Number of children: 1  . Years of education: N/A   Occupational History  . [employer]   Social History Main Topics  . Smoking status: Never Smoker  . Smokeless tobacco: Never Used  . Alcohol use Yes     Comment: rarely  . Drug use: No  . Sexual activity: Not on file   Other Topics Concern  . Not on file   Social History Narrative   Daughter 2010      Allergies as of 10/14/2016      Reactions   Dust Mite Extract Hives   Takes Xyzal      Medication List       Accurate as of 10/14/16 11:59 PM. Always use your most recent med list.          atorvastatin 20 MG tablet Commonly known as:  LIPITOR Take 1 tablet (20 mg total) by mouth daily.   CENTRUM PO Take 1 tablet by mouth daily.   dapagliflozin propanediol 5 MG Tabs tablet Commonly known as:  FARXIGA Take 5 mg by mouth daily.   JENTADUETO 2.12-998  MG Tabs Generic drug:  Linagliptin-Metformin HCl Take 1 tablet by mouth 2 (two) times daily.   levocetirizine 5 MG tablet Commonly known as:  XYZAL Take 1 tablet (5 mg total) by mouth daily.   ondansetron 4 MG tablet Commonly known as:  ZOFRAN Take 1-2 tablets (4-8 mg total) by mouth every 8 (eight) hours as needed for nausea or vomiting.   ONE TOUCH ULTRA TEST test strip Generic drug:  glucose blood Reported on 03/16/2016          Objective:   Physical Exam BP 118/76 (BP Location: Left Arm, Patient Position: Sitting, Cuff Size: Normal)   Pulse 88   Temp 98 F (36.7 C) (Oral)   Resp 14   Ht 5\' 9"  (1.753 m)   Wt 223 lb 4 oz (101.3 kg)   SpO2 98%   BMI 32.97 kg/m  General:   Well developed, well nourished . NAD.  HEENT:  Normocephalic . Face symmetric, atraumatic. Not jaundice or pale. Oral membranes are minimally dry Lungs:  CTA B Normal respiratory effort, no intercostal retractions, no accessory muscle use. Heart: RRR,  no murmur.  no pretibial edema bilaterally  Abdomen:  Not distended, soft, non-tender. No  rebound or rigidity. Slightly increased bowel sounds Skin: Not pale. Not jaundice Neurologic:  alert & oriented X3.  Speech normal, gait appropriate for age and unassisted Psych--  Cognition and judgment appear intact.  Cooperative with normal attention span and concentration.  Behavior appropriate. No anxious or depressed appearing.    Assessment & Plan:   Assessment Diabetes- saw Dr Buddy Duty from 2013 to 02-2016, back to PCP care  Hyperlipidemia Seasonal allergies  Plan: Acute gastroenteritis: Patient is nontoxic, vital signs stable, has been able to drink fluids. Recommend to continue bland diet, rest, Zofran, Pepto-Bismol. Hold diabetes medicines until by mouth intake increased particularly metformin d/t risk of dehydration. C instructions, call if not better

## 2016-10-14 NOTE — Patient Instructions (Signed)
Drink plenty of clear fluids, follow a bland diet  Zofran as needed for nausea  Pepto-Bismol OTC as needed for diarrhea  Don't take your diabetes medicine until you feel better  Call if fever, chills, severe symptoms, blood in the stools, not improving in the next 48 hours  If your sugar is high, is okay for few days. If you sugar goes over 300 please let me know

## 2016-10-14 NOTE — Progress Notes (Signed)
Pre visit review using our clinic review tool, if applicable. No additional management support is needed unless otherwise documented below in the visit note. 

## 2016-10-15 NOTE — Assessment & Plan Note (Signed)
Acute gastroenteritis: Patient is nontoxic, vital signs stable, has been able to drink fluids. Recommend to continue bland diet, rest, Zofran, Pepto-Bismol. Hold diabetes medicines until by mouth intake increased particularly metformin d/t risk of dehydration. C instructions, call if not better

## 2016-10-16 ENCOUNTER — Other Ambulatory Visit: Payer: Self-pay | Admitting: Internal Medicine

## 2016-11-16 ENCOUNTER — Other Ambulatory Visit: Payer: Self-pay

## 2016-11-16 MED ORDER — DAPAGLIFLOZIN PROPANEDIOL 5 MG PO TABS: 5.0000 mg | ORAL_TABLET | Freq: Every day | ORAL | 1 refills | Status: DC

## 2016-11-23 ENCOUNTER — Other Ambulatory Visit: Payer: Self-pay | Admitting: Internal Medicine

## 2016-12-02 ENCOUNTER — Other Ambulatory Visit: Payer: Self-pay | Admitting: Internal Medicine

## 2017-03-09 ENCOUNTER — Other Ambulatory Visit: Payer: Self-pay | Admitting: Internal Medicine

## 2017-05-15 ENCOUNTER — Other Ambulatory Visit: Payer: Self-pay | Admitting: Internal Medicine

## 2017-05-18 ENCOUNTER — Encounter: Payer: Self-pay | Admitting: Internal Medicine

## 2017-05-18 ENCOUNTER — Other Ambulatory Visit: Payer: Self-pay | Admitting: Internal Medicine

## 2017-05-18 ENCOUNTER — Ambulatory Visit (INDEPENDENT_AMBULATORY_CARE_PROVIDER_SITE_OTHER): Payer: 59 | Admitting: Internal Medicine

## 2017-05-18 VITALS — BP 118/76 | HR 71 | Temp 97.7°F | Ht 69.0 in | Wt 228.0 lb

## 2017-05-18 DIAGNOSIS — Z0000 Encounter for general adult medical examination without abnormal findings: Secondary | ICD-10-CM | POA: Diagnosis not present

## 2017-05-18 DIAGNOSIS — E119 Type 2 diabetes mellitus without complications: Secondary | ICD-10-CM | POA: Diagnosis not present

## 2017-05-18 LAB — LIPID PANEL
CHOL/HDL RATIO: 2
Cholesterol: 129 mg/dL (ref 0–200)
HDL: 51.6 mg/dL (ref >39.00)
LDL CALC: 57 mg/dL (ref 0–99)
NonHDL: 77.05
TRIGLYCERIDES: 101 mg/dL (ref 0.0–149.0)
VLDL: 20.2 mg/dL (ref 0.0–40.0)

## 2017-05-18 LAB — COMPREHENSIVE METABOLIC PANEL
ALK PHOS: 69 U/L (ref 39–117)
ALT: 20 U/L (ref 0–53)
AST: 20 U/L (ref 0–37)
Albumin: 4.4 g/dL (ref 3.5–5.2)
BILIRUBIN TOTAL: 0.9 mg/dL (ref 0.2–1.2)
BUN: 12 mg/dL (ref 6–23)
CALCIUM: 9.4 mg/dL (ref 8.4–10.5)
CO2: 27 mEq/L (ref 19–32)
Chloride: 103 mEq/L (ref 96–112)
Creatinine, Ser: 0.6 mg/dL (ref 0.40–1.50)
GFR: 152.21 mL/min (ref >60.00)
Glucose, Bld: 138 mg/dL — ABNORMAL HIGH (ref 70–99)
Potassium: 4.1 mEq/L (ref 3.5–5.1)
Sodium: 137 mEq/L (ref 135–145)
TOTAL PROTEIN: 7.3 g/dL (ref 6.0–8.3)

## 2017-05-18 LAB — HEMOGLOBIN A1C: HEMOGLOBIN A1C: 7.6 % — AB (ref 4.6–6.5)

## 2017-05-18 LAB — MICROALBUMIN / CREATININE URINE RATIO
Creatinine,U: 62.7 mg/dL
Microalb Creat Ratio: 1.1 mg/g (ref 0.0–30.0)
Microalb, Ur: <0.7 mg/dL (ref 0.0–1.9)

## 2017-05-18 LAB — TSH: TSH: 1.16 u[IU]/mL (ref 0.35–4.50)

## 2017-05-18 NOTE — Telephone Encounter (Signed)
Pt requesting refill on Jentadueto- labs today completed. A1c- 7.6. Please advise.

## 2017-05-18 NOTE — Assessment & Plan Note (Addendum)
-  Td 4-11;  pnm 23: 10-2014;  Prevnar 2017;rec flu shot -Discussed  Lifestyle, regular exercise encouraged! -Labs:  CMP, FLP, A1c, micral, TSH

## 2017-05-18 NOTE — Assessment & Plan Note (Signed)
DM: Feet exam negative today, recommend eye check yearly. CBGs a.m. 140, in the afternoon 155. Continue jentadueto-Farxiga. Check A1c and micro. Hyperlipidemia: Continue Lipitor, check labs. RTC 4 months

## 2017-05-18 NOTE — Patient Instructions (Signed)
GO TO THE LAB : Get the blood work     GO TO THE FRONT DESK Schedule your next appointment for a  Check up in 4 months   

## 2017-05-18 NOTE — Progress Notes (Signed)
Subjective:    Patient ID: Dustin Goodman, male    DOB: 1966-08-09, 51 y.o.   MRN: [id number]  DOS:  05/18/2017 Type of visit - description : cpx Interval history:  No concerns     Review of Systems   A 14 point review of systems is negative    Past Medical History:  Diagnosis Date  . Allergy    seasonal  . Diabetes mellitus 1998   type II  . Hyperlipidemia     Past Surgical History:  Procedure Laterality Date  . CHOLECYSTECTOMY  1998  . SHOULDER ARTHROSCOPY Right 2010    Dr Onnie Graham     Social History   Social History  . Marital status: Married    Spouse name: N/A  . Number of children: 1  . Years of education: N/A   Occupational History  . [employer]   Social History Main Topics  . Smoking status: Never Smoker  . Smokeless tobacco: Never Used  . Alcohol use Yes     Comment: rarely  . Drug use: No  . Sexual activity: Not on file   Other Topics Concern  . Not on file   Social History Narrative   Daughter 2010     Family History  Problem Relation Age of Onset  . Cancer Maternal Aunt        breast  . Diabetes Maternal Uncle   . Diabetes Maternal Grandmother   . Breast cancer Mother   . Colon cancer Neg Hx   . Prostate cancer Neg Hx   . CAD Neg Hx      Allergies as of 05/18/2017      Reactions   Dust Mite Extract Hives   Takes Xyzal      Medication List       Accurate as of 05/18/17  5:24 PM. Always use your most recent med list.          atorvastatin 20 MG tablet Commonly known as:  LIPITOR Take 1 tablet (20 mg total) by mouth daily.   CENTRUM PO Take 1 tablet by mouth daily.   dapagliflozin propanediol 5 MG Tabs tablet Commonly known as:  FARXIGA Take 5 mg by mouth daily.   levocetirizine 5 MG tablet Commonly known as:  XYZAL Take 1 tablet (5 mg total) by mouth daily.   Linagliptin-Metformin HCl 2.12-998 MG Tabs Commonly known as:  JENTADUETO Take 1 tablet by mouth 2 (two) times daily.   ONE  TOUCH ULTRA TEST test strip Generic drug:  glucose blood Reported on 03/16/2016            Discharge Care Instructions        Start     Ordered   05/18/17 0000  Comprehensive metabolic panel     10/62/69 0822   05/18/17 0000  Lipid panel     05/18/17 0822   05/18/17 0000  Hemoglobin A1c     05/18/17 0822   05/18/17 0000  Microalbumin / creatinine urine ratio     05/18/17 0822   05/18/17 0000  TSH     05/18/17 0822         Objective:   Physical Exam BP 118/76 (BP Location: Right Arm, Patient Position: Sitting, Cuff Size: Normal)   Pulse 71   Temp 97.7 F (36.5 C) (Oral)   Ht 5\' 9"  (1.753 m)   Wt 228 lb (103.4 kg)   SpO2 98%   BMI 33.67 kg/m  General:   Well  developed, well nourished . NAD.  Neck: No  thyromegaly  HEENT:  Normocephalic . Face symmetric, atraumatic Lungs:  CTA B Normal respiratory effort, no intercostal retractions, no accessory muscle use. Heart: RRR,  no murmur.  No pretibial edema bilaterally  Abdomen:  Not distended, soft, non-tender. No rebound or rigidity.   Skin: Exposed areas without rash. Not pale. Not jaundice DIABETIC FEET EXAM: No lower extremity edema Normal pedal pulses bilaterally Skin normal, nails normal, no calluses Pinprick examination of the feet normal.  Neurologic:  alert & oriented X3.  Speech normal, gait appropriate for age and unassisted Strength symmetric and appropriate for age.  Psych: Cognition and judgment appear intact.  Cooperative with normal attention span and concentration.  Behavior appropriate. No anxious or depressed appearing.     Assessment & Plan:   Assessment Diabetes- saw Dr Buddy Duty from 2013 to 02-2016, back to PCP care  Hyperlipidemia Seasonal allergies  Plan: DM: Feet exam negative today, recommend eye check yearly. CBGs a.m. 140, in the afternoon 155. Continue jentadueto-Farxiga. Check A1c and micro. Hyperlipidemia: Continue Lipitor, check labs. RTC 4 months

## 2017-05-20 MED ORDER — DAPAGLIFLOZIN PROPANEDIOL 10 MG PO TABS: 10.0000 mg | ORAL_TABLET | Freq: Every day | ORAL | 0 refills | Status: DC

## 2017-05-20 NOTE — Telephone Encounter (Signed)
Rx sent 

## 2017-05-20 NOTE — Addendum Note (Signed)
Addended byDamita Dunnings D on: 05/20/2017 01:34 PM   Modules accepted: Orders

## 2017-05-20 NOTE — Telephone Encounter (Signed)
Okay to refill, see labs. farxiga dose changed

## 2017-08-13 ENCOUNTER — Telehealth: Payer: Self-pay

## 2017-08-13 ENCOUNTER — Other Ambulatory Visit: Payer: Self-pay | Admitting: Internal Medicine

## 2017-08-13 NOTE — Telephone Encounter (Signed)
PA initiated via Covermymeds; KEY: [id number]. Awaiting determination.

## 2017-08-13 NOTE — Telephone Encounter (Signed)
Your PA request has been closed. JENTADUETO TAB 2.5-10-- E11.9-Type 2 diabetes mellitus without complications--No PA Required, Test Claim Pays 180/90s, Wil need PA starting 10/2017 Lavone Nian, 08/13/2017 02:18 PM

## 2017-08-17 ENCOUNTER — Other Ambulatory Visit: Payer: Self-pay

## 2017-08-17 ENCOUNTER — Other Ambulatory Visit: Payer: Self-pay | Admitting: Internal Medicine

## 2017-08-17 MED ORDER — DAPAGLIFLOZIN PROPANEDIOL 10 MG PO TABS: 10.0000 mg | ORAL_TABLET | Freq: Every day | ORAL | 1 refills | Status: DC

## 2017-09-04 ENCOUNTER — Other Ambulatory Visit: Payer: Self-pay | Admitting: Internal Medicine

## 2017-09-17 ENCOUNTER — Encounter: Payer: Self-pay | Admitting: Internal Medicine

## 2017-09-17 ENCOUNTER — Ambulatory Visit (INDEPENDENT_AMBULATORY_CARE_PROVIDER_SITE_OTHER): Payer: 59 | Admitting: Internal Medicine

## 2017-09-17 VITALS — BP 126/80 | HR 94 | Temp 98.1°F | Resp 14 | Ht 69.0 in | Wt 228.0 lb

## 2017-09-17 DIAGNOSIS — E119 Type 2 diabetes mellitus without complications: Secondary | ICD-10-CM | POA: Diagnosis not present

## 2017-09-17 DIAGNOSIS — Z09 Encounter for follow-up examination after completed treatment for conditions other than malignant neoplasm: Secondary | ICD-10-CM

## 2017-09-17 LAB — BASIC METABOLIC PANEL
BUN: 12 mg/dL (ref 6–23)
CHLORIDE: 102 meq/L (ref 96–112)
CO2: 24 mEq/L (ref 19–32)
CREATININE: 0.64 mg/dL (ref 0.40–1.50)
Calcium: 9 mg/dL (ref 8.4–10.5)
GFR: 141.09 mL/min (ref >60.00)
GLUCOSE: 137 mg/dL — AB (ref 70–99)
POTASSIUM: 3.8 meq/L (ref 3.5–5.1)
Sodium: 137 mEq/L (ref 135–145)

## 2017-09-17 LAB — HEMOGLOBIN A1C: HEMOGLOBIN A1C: 7.7 % — AB (ref 4.6–6.5)

## 2017-09-17 NOTE — Patient Instructions (Signed)
GO TO THE LAB : Get the blood work     GO TO THE FRONT DESK Schedule your next appointment for a check up in 4 months    DIABETES self learn tools: Online resources: The American diabetes Association     diabetes.[facility] website it is a Microbiologist  [place].org  The [facility] web site has a diabetes section  [employer]   Apply hydrocortisone 1% OTC twice a day x 1 week

## 2017-09-17 NOTE — Assessment & Plan Note (Signed)
DM: Last A1c elevated, was recommended to continue Jentadueto and increase farxiga to 10 mg.  Diet wasn't good during the Christmas time, now is back to a more healthy diet.  Will check a BMP and A1c.  Encourage self learning, follow a healthy diet and stay active. Cough: Symptoms started yesterday, exam is normal today except for some nose congestion, likely a URI, recommend OTCs. Blister, right third toe: Now with some discoloration, likely postinflammatory hyperpigmentation, no active infection that I can tell, recommend hydrocortisone as needed. RTC 4 months

## 2017-09-17 NOTE — Progress Notes (Signed)
Subjective:    Patient ID: Dustin Goodman, male    DOB: 1966-04-13, 52 y.o.   MRN: [id number]  DOS:  09/17/2017 Type of visit - description : f/u Interval history: DM: Good compliance with medicines, diet was not the best around Christmas time, CBGs went up to the 190s, now CBGs back down to the 150s, 160s. Several days ago noted a blister at the third right toe,  now the area looks slightly irritated.  No actual pain or itching Yesterday had cough, feeling better today.  + Subjective fever.  Wt Readings from Last 3 Encounters:  09/17/17 228 lb (103.4 kg)  05/18/17 228 lb (103.4 kg)  10/14/16 223 lb 4 oz (101.3 kg)     Review of Systems  Denies nausea or vomiting farxiga dose increased,  no penile rash.  Past Medical History:  Diagnosis Date  . Allergy    seasonal  . Diabetes mellitus 1998   type II  . Hyperlipidemia     Past Surgical History:  Procedure Laterality Date  . CHOLECYSTECTOMY  1998  . SHOULDER ARTHROSCOPY Right 2010    Dr Onnie Graham     Social History   Socioeconomic History  . Marital status: Married    Spouse name: Not on file  . Number of children: 1  . Years of education: Not on file  . Highest education level: Not on file  Social Needs  . Financial resource strain: Not on file  . Food insecurity - worry: Not on file  . Food insecurity - inability: Not on file  . Transportation needs - medical: Not on file  . Transportation needs - non-medical: Not on file  Occupational History  . Occupation: Medical sales representative    Employer: [employer]  Tobacco Use  . Smoking status: Never Smoker  . Smokeless tobacco: Never Used  Substance and Sexual Activity  . Alcohol use: Yes    Comment: rarely  . Drug use: No  . Sexual activity: Not on file  Other Topics Concern  . Not on file  Social History Narrative   Daughter 2010      Allergies as of 09/17/2017      Reactions   Dust Mite Extract Hives   Takes Xyzal      Medication List        Accurate as of 09/17/17  8:29 AM. Always use your most recent med list.          atorvastatin 20 MG tablet Commonly known as:  LIPITOR Take 1 tablet (20 mg total) by mouth daily.   CENTRUM PO Take 1 tablet by mouth daily.   dapagliflozin propanediol 10 MG Tabs tablet Commonly known as:  FARXIGA Take 10 mg by mouth daily.   levocetirizine 5 MG tablet Commonly known as:  XYZAL Take 1 tablet (5 mg total) by mouth daily.   Linagliptin-Metformin HCl 2.12-998 MG Tabs Commonly known as:  JENTADUETO Take 1 tablet by mouth 2 (two) times daily.   ONE TOUCH ULTRA TEST test strip Generic drug:  glucose blood Reported on 03/16/2016          Objective:   Physical Exam BP 126/80 (BP Location: Right Arm, Patient Position: Sitting, Cuff Size: Normal)   Pulse 94   Temp 98.1 F (36.7 C) (Oral)   Resp 14   Ht 5\' 9"  (1.753 m)   Wt 228 lb (103.4 kg)   SpO2 97%   BMI 33.67 kg/m  General:   Well developed, well nourished . NAD.  HEENT:  Normocephalic . Face symmetric, atraumatic.  Throat symmetric and not red.  Nose is slightly congested  lungs:  CTA B Normal respiratory effort, no intercostal retractions, no accessory muscle use. Heart: RRR,  no murmur.  No pretibial edema bilaterally  Skin: Third right toe with some discoloration distally but no ulcer, redness, abscess, discharge.  No deformity Neurologic:  alert & oriented X3.  Speech normal, gait appropriate for age and unassisted Psych--  Cognition and judgment appear intact.  Cooperative with normal attention span and concentration.  Behavior appropriate. No anxious or depressed appearing.      Assessment & Plan:   Assessment Diabetes- saw Dr Buddy Duty from 2013 to 02-2016, back to PCP care  Hyperlipidemia Seasonal allergies  Plan: DM: Last A1c elevated, was recommended to continue Jentadueto and increase farxiga to 10 mg.  Diet wasn't good during the Christmas time, now is back to a more healthy diet.  Will check a BMP  and A1c.  Encourage self learning, follow a healthy diet and stay active. Cough: Symptoms started yesterday, exam is normal today except for some nose congestion, likely a URI, recommend OTCs. Blister, right third toe: Now with some discoloration, likely postinflammatory hyperpigmentation, no active infection that I can tell, recommend hydrocortisone as needed. RTC 4 months

## 2017-09-17 NOTE — Progress Notes (Signed)
Pre visit review using our clinic review tool, if applicable. No additional management support is needed unless otherwise documented below in the visit note. 

## 2017-10-21 ENCOUNTER — Other Ambulatory Visit: Payer: Self-pay | Admitting: Internal Medicine

## 2017-11-27 ENCOUNTER — Other Ambulatory Visit: Payer: Self-pay | Admitting: Internal Medicine

## 2017-11-30 ENCOUNTER — Telehealth: Payer: Self-pay

## 2017-11-30 NOTE — Telephone Encounter (Signed)
PA initiated via Covermymeds; KEY: [id number]. Awaiting determination.

## 2017-11-30 NOTE — Telephone Encounter (Signed)
PA approved. Effective 11/30/2017 through 12/01/2018.

## 2017-12-01 ENCOUNTER — Other Ambulatory Visit: Payer: Self-pay | Admitting: Internal Medicine

## 2017-12-20 ENCOUNTER — Other Ambulatory Visit: Payer: Self-pay | Admitting: Internal Medicine

## 2018-01-17 ENCOUNTER — Ambulatory Visit: Payer: 59 | Admitting: Internal Medicine

## 2018-02-03 ENCOUNTER — Encounter: Payer: Self-pay | Admitting: Internal Medicine

## 2018-02-03 ENCOUNTER — Ambulatory Visit (INDEPENDENT_AMBULATORY_CARE_PROVIDER_SITE_OTHER): Payer: 59 | Admitting: Internal Medicine

## 2018-02-03 VITALS — BP 133/86 | HR 64 | Temp 97.9°F | Resp 16 | Ht 69.0 in | Wt 227.0 lb

## 2018-02-03 DIAGNOSIS — E119 Type 2 diabetes mellitus without complications: Secondary | ICD-10-CM

## 2018-02-03 DIAGNOSIS — F419 Anxiety disorder, unspecified: Secondary | ICD-10-CM | POA: Diagnosis not present

## 2018-02-03 DIAGNOSIS — G4700 Insomnia, unspecified: Secondary | ICD-10-CM | POA: Diagnosis not present

## 2018-02-03 LAB — HEMOGLOBIN A1C: HEMOGLOBIN A1C: 7.7 % — AB (ref 4.6–6.5)

## 2018-02-03 LAB — BASIC METABOLIC PANEL
BUN: 12 mg/dL (ref 6–23)
CHLORIDE: 102 meq/L (ref 96–112)
CO2: 27 mEq/L (ref 19–32)
Calcium: 9.4 mg/dL (ref 8.4–10.5)
Creatinine, Ser: 0.58 mg/dL (ref 0.40–1.50)
GFR: 157.82 mL/min (ref >60.00)
GLUCOSE: 133 mg/dL — AB (ref 70–99)
POTASSIUM: 4.1 meq/L (ref 3.5–5.1)
Sodium: 139 mEq/L (ref 135–145)

## 2018-02-03 LAB — CBC
HEMATOCRIT: 44.3 % (ref 39.0–52.0)
HEMOGLOBIN: 15.3 g/dL (ref 13.0–17.0)
MCHC: 34.4 g/dL (ref 30.0–36.0)
MCV: 89.4 fl (ref 78.0–100.0)
PLATELETS: 235 10*3/uL (ref 150.0–400.0)
RBC: 4.96 Mil/uL (ref 4.22–5.81)
RDW: 12.8 % (ref 11.5–15.5)
WBC: 6.2 10*3/uL (ref 4.0–10.5)

## 2018-02-03 NOTE — Progress Notes (Signed)
bm 

## 2018-02-03 NOTE — Assessment & Plan Note (Signed)
DM: Last A1c 7.7, good compliance with Jentadueto and [place].  Checking BMP, A1c and CBC.  Lifestyle not the best, see next.  We will change his medication only if the A1c is much worse (at this point he is more concerned about work issues) .  Feet exam negative Anxiety, insomnia, stress: Provided listening therapy and counseling regards stress, work related; encouraged daily exercise and seek professional counseling.  Information provided.  Discussed medications, he is afraid of them, explained that there are excellent medications  in case he needs them. Recommend melatonin and good sleep habits. RTC 3 months

## 2018-02-03 NOTE — Progress Notes (Signed)
Subjective:    Patient ID: Dustin Goodman, male    DOB: 1966-01-27, 52 y.o.   MRN: [id number]  DOS:  02/03/2018 Type of visit - description : rov Interval history: His main concern today is  stress, and there has been a change in his management in his company and for the last 3 months he has been under a lot of pressure. States that he is almost on a panic mode few hours before going to work and during work. It is affecting his family life, it is affecting his his sleep. Admits diet is not the best and he has no time to exercise. Sleep is broken.    Review of Systems Denies nausea, vomiting but occasionally has diarrhea mostly during stressful times.  Also  heartburn.  Past Medical History:  Diagnosis Date  . Allergy    seasonal  . Diabetes mellitus 1998   type II  . Hyperlipidemia     Past Surgical History:  Procedure Laterality Date  . CHOLECYSTECTOMY  1998  . SHOULDER ARTHROSCOPY Right 2010    Dr Onnie Graham     Social History   Socioeconomic History  . Marital status: Married    Spouse name: Not on file  . Number of children: 1  . Years of education: Not on file  . Highest education level: Not on file  Occupational History  . Occupation: Medical sales representative    Employer: [employer]  Social Needs  . Financial resource strain: Not on file  . Food insecurity:    Worry: Not on file    Inability: Not on file  . Transportation needs:    Medical: Not on file    Non-medical: Not on file  Tobacco Use  . Smoking status: Never Smoker  . Smokeless tobacco: Never Used  Substance and Sexual Activity  . Alcohol use: Yes    Comment: rarely  . Drug use: No  . Sexual activity: Not on file  Lifestyle  . Physical activity:    Days per week: Not on file    Minutes per session: Not on file  . Stress: Not on file  Relationships  . Social connections:    Talks on phone: Not on file    Gets together: Not on file    Attends religious service: Not on file    Active  member of club or organization: Not on file    Attends meetings of clubs or organizations: Not on file    Relationship status: Not on file  . Intimate partner violence:    Fear of current or ex partner: Not on file    Emotionally abused: Not on file    Physically abused: Not on file    Forced sexual activity: Not on file  Other Topics Concern  . Not on file  Social History Narrative   Daughter 2010      Allergies as of 02/03/2018      Reactions   Dust Mite Extract Hives   Takes Xyzal      Medication List        Accurate as of 02/03/18  6:46 PM. Always use your most recent med list.          atorvastatin 20 MG tablet Commonly known as:  LIPITOR Take 1 tablet (20 mg total) by mouth daily.   CENTRUM PO Take 1 tablet by mouth daily.   dapagliflozin propanediol 10 MG Tabs tablet Commonly known as:  FARXIGA Take 10 mg by mouth daily.  levocetirizine 5 MG tablet Commonly known as:  XYZAL Take 1 tablet (5 mg total) by mouth daily.   linaGLIPtin-metFORMIN HCl 2.12-998 MG Tabs Commonly known as:  JENTADUETO Take 1 tablet by mouth 2 (two) times daily.   ONE TOUCH ULTRA TEST test strip Generic drug:  glucose blood Reported on 03/16/2016          Objective:   Physical Exam BP 133/86 (BP Location: Right Arm, Patient Position: Sitting, Cuff Size: Small)   Pulse 64   Temp 97.9 F (36.6 C) (Oral)   Resp 16   Ht 5\' 9"  (1.753 m)   Wt 227 lb (103 kg)   SpO2 100%   BMI 33.52 kg/m  General:   Well developed, well nourished . NAD.  HEENT:  Normocephalic . Face symmetric, atraumatic Lungs:  CTA B Normal respiratory effort, no intercostal retractions, no accessory muscle use. Heart: RRR,  no murmur.  No pretibial edema bilaterally   DIABETIC FEET EXAM: No lower extremity edema Normal pedal pulses bilaterally Skin normal, nails normal, no calluses Pinprick examination of the feet normal. Neurologic:  alert & oriented X3.  Speech normal, gait appropriate for age  and unassisted Psych--  Cognition and judgment appear intact.  Cooperative with normal attention span and concentration.  Behavior appropriate. Patient seems somewhat anxious and frustrated.  No depressed     Assessment & Plan:    Assessment Diabetes- saw Dr Buddy Duty from 2013 to 02-2016, back to PCP care  Hyperlipidemia Seasonal allergies  Plan: DM: Last A1c 7.7, good compliance with Jentadueto and [place].  Checking BMP, A1c and CBC.  Lifestyle not the best, see next.  We will change his medication only if the A1c is much worse (at this point he is more concerned about work issues) .  Feet exam negative Anxiety, insomnia, stress: Provided listening therapy and counseling regards stress, work related; encouraged daily exercise and seek professional counseling.  Information provided.  Discussed medications, he is afraid of them, explained that there are excellent medications  in case he needs them. Recommend melatonin and good sleep habits. RTC 3 months  Today, I spent more than 25   min with the patient: >50% of the time counseling regards stress, listening therapy and giving advise

## 2018-02-03 NOTE — Patient Instructions (Signed)
GO TO THE LAB : Get the blood work     GO TO THE FRONT DESK Schedule your next appointment for a checkup in 3 months  Sleep: Take OTC melatonin every night  Please consider see a counselor to help with stress management  HEALTHY SLEEP Sleep hygiene: Basic rules for a good night's sleep  Sleep only as much as you need to feel rested and then get out of bed  Keep a regular sleep schedule  Avoid forcing sleep  Exercise regularly for at least 20 minutes, preferably 4 to 5 hours before bedtime  Avoid caffeinated beverages after lunch  Avoid alcohol near bedtime: no "night cap"  Avoid smoking, especially in the evening  Do not go to bed hungry  Adjust bedroom environment  Avoid prolonged use of light-emitting screens before bedtime   Deal with your worries before bedtime

## 2018-02-04 NOTE — Progress Notes (Signed)
Letter printed per Dr. Larose Kells request, and placed in outgoing mail for pickup.

## 2018-03-18 ENCOUNTER — Other Ambulatory Visit: Payer: Self-pay | Admitting: Internal Medicine

## 2018-04-25 ENCOUNTER — Other Ambulatory Visit: Payer: Self-pay

## 2018-04-25 MED ORDER — DAPAGLIFLOZIN PROPANEDIOL 10 MG PO TABS: 10.0000 mg | ORAL_TABLET | Freq: Every day | ORAL | 1 refills | Status: DC

## 2018-04-26 ENCOUNTER — Telehealth: Payer: Self-pay | Admitting: *Deleted

## 2018-04-26 MED ORDER — DAPAGLIFLOZIN PROPANEDIOL 10 MG PO TABS: 10.0000 mg | ORAL_TABLET | Freq: Every day | ORAL | 1 refills | Status: DC

## 2018-04-26 NOTE — Telephone Encounter (Signed)
Received fax from [facility] requesting refill of 10mg , #90. Rx sent.

## 2018-05-06 ENCOUNTER — Ambulatory Visit: Payer: 59 | Admitting: Internal Medicine

## 2018-05-25 ENCOUNTER — Ambulatory Visit: Payer: 59 | Admitting: Internal Medicine

## 2018-05-27 ENCOUNTER — Encounter: Payer: Self-pay | Admitting: Internal Medicine

## 2018-05-27 ENCOUNTER — Ambulatory Visit (INDEPENDENT_AMBULATORY_CARE_PROVIDER_SITE_OTHER): Payer: 59 | Admitting: Internal Medicine

## 2018-05-27 VITALS — BP 126/68 | HR 69 | Temp 97.7°F | Resp 16 | Ht 69.0 in | Wt 224.5 lb

## 2018-05-27 DIAGNOSIS — E7800 Pure hypercholesterolemia, unspecified: Secondary | ICD-10-CM | POA: Diagnosis not present

## 2018-05-27 DIAGNOSIS — E119 Type 2 diabetes mellitus without complications: Secondary | ICD-10-CM

## 2018-05-27 DIAGNOSIS — R221 Localized swelling, mass and lump, neck: Secondary | ICD-10-CM | POA: Diagnosis not present

## 2018-05-27 DIAGNOSIS — F419 Anxiety disorder, unspecified: Secondary | ICD-10-CM

## 2018-05-27 LAB — COMPREHENSIVE METABOLIC PANEL
ALT: 16 U/L (ref 0–53)
AST: 14 U/L (ref 0–37)
Albumin: 4.5 g/dL (ref 3.5–5.2)
Alkaline Phosphatase: 74 U/L (ref 39–117)
BUN: 13 mg/dL (ref 6–23)
CHLORIDE: 102 meq/L (ref 96–112)
CO2: 30 meq/L (ref 19–32)
CREATININE: 0.74 mg/dL (ref 0.40–1.50)
Calcium: 9.3 mg/dL (ref 8.4–10.5)
GFR: 118.99 mL/min (ref >60.00)
GLUCOSE: 148 mg/dL — AB (ref 70–99)
POTASSIUM: 4.5 meq/L (ref 3.5–5.1)
SODIUM: 139 meq/L (ref 135–145)
Total Bilirubin: 1.1 mg/dL (ref 0.2–1.2)
Total Protein: 7 g/dL (ref 6.0–8.3)

## 2018-05-27 LAB — CBC WITH DIFFERENTIAL/PLATELET
BASOS ABS: 0 10*3/uL (ref 0.0–0.1)
BASOS PCT: 0.4 % (ref 0.0–3.0)
EOS ABS: 0.1 10*3/uL (ref 0.0–0.7)
Eosinophils Relative: 1.3 % (ref 0.0–5.0)
HEMATOCRIT: 45.3 % (ref 39.0–52.0)
HEMOGLOBIN: 15.8 g/dL (ref 13.0–17.0)
LYMPHS PCT: 23.7 % (ref 12.0–46.0)
Lymphs Abs: 1.6 10*3/uL (ref 0.7–4.0)
MCHC: 34.8 g/dL (ref 30.0–36.0)
MCV: 87.8 fl (ref 78.0–100.0)
MONO ABS: 0.7 10*3/uL (ref 0.1–1.0)
Monocytes Relative: 9.8 % (ref 3.0–12.0)
Neutro Abs: 4.3 10*3/uL (ref 1.4–7.7)
Neutrophils Relative %: 64.8 % (ref 43.0–77.0)
Platelets: 256 10*3/uL (ref 150.0–400.0)
RBC: 5.16 Mil/uL (ref 4.22–5.81)
RDW: 13.1 % (ref 11.5–15.5)
WBC: 6.7 10*3/uL (ref 4.0–10.5)

## 2018-05-27 LAB — MICROALBUMIN / CREATININE URINE RATIO
CREATININE, U: 103 mg/dL
MICROALB UR: 0.7 mg/dL (ref 0.0–1.9)
Microalb Creat Ratio: 0.7 mg/g (ref 0.0–30.0)

## 2018-05-27 LAB — LIPID PANEL
CHOL/HDL RATIO: 3
Cholesterol: 135 mg/dL (ref 0–200)
HDL: 42.9 mg/dL (ref >39.00)
LDL CALC: 72 mg/dL (ref 0–99)
NonHDL: 91.77
Triglycerides: 98 mg/dL (ref 0.0–149.0)
VLDL: 19.6 mg/dL (ref 0.0–40.0)

## 2018-05-27 LAB — HEMOGLOBIN A1C: Hgb A1c MFr Bld: 7.7 % — ABNORMAL HIGH (ref 4.6–6.5)

## 2018-05-27 NOTE — Progress Notes (Signed)
Subjective:    Patient ID: Dustin Goodman, male    DOB: 1966-04-19, 52 y.o.   MRN: [id number]  DOS:  05/27/2018 Type of visit - description : f/u Interval history: DM: Good med compliance, ambulatory CBGs usually 170 but sometimes CBP spikes w/o apparent reason.  He is trying to do better with diet, avoiding snacking and ice cream. High cholesterol: Good compliance with medication, he is fasting. Stress at work continue, "I think I am getting used to it".  Less bothered by it.  He still has difficulty sleeping despite trial with OTCs such as melatonin. Today for the first time he mentioned swelling at the left neck, sometimes the area seems more swelling and occasionally tender.  Does not change when he chews or eat.  Review of Systems  Denies fever or chills.  Has lost 3 pounds, probably related to trying to eat healthy.  Occasional night sweats?. Denies dysphasia, odynophagia No recent dental pain or infections.  Past Medical History:  Diagnosis Date  . Allergy    seasonal  . Diabetes mellitus 1998   type II  . Hyperlipidemia     Past Surgical History:  Procedure Laterality Date  . CHOLECYSTECTOMY  1998  . SHOULDER ARTHROSCOPY Right 2010    Dr Onnie Graham     Social History   Socioeconomic History  . Marital status: Married    Spouse name: Not on file  . Number of children: 1  . Years of education: Not on file  . Highest education level: Not on file  Occupational History  . Occupation: Medical sales representative    Employer: [employer]  Social Needs  . Financial resource strain: Not on file  . Food insecurity:    Worry: Not on file    Inability: Not on file  . Transportation needs:    Medical: Not on file    Non-medical: Not on file  Tobacco Use  . Smoking status: Never Smoker  . Smokeless tobacco: Never Used  Substance and Sexual Activity  . Alcohol use: Yes    Comment: rarely  . Drug use: No  . Sexual activity: Not on file  Lifestyle  . Physical activity:     Days per week: Not on file    Minutes per session: Not on file  . Stress: Not on file  Relationships  . Social connections:    Talks on phone: Not on file    Gets together: Not on file    Attends religious service: Not on file    Active member of club or organization: Not on file    Attends meetings of clubs or organizations: Not on file    Relationship status: Not on file  . Intimate partner violence:    Fear of current or ex partner: Not on file    Emotionally abused: Not on file    Physically abused: Not on file    Forced sexual activity: Not on file  Other Topics Concern  . Not on file  Social History Narrative   Daughter 2010      Allergies as of 05/27/2018      Reactions   Dust Mite Extract Hives   Takes Xyzal      Medication List        Accurate as of 05/27/18 11:59 PM. Always use your most recent med list.          atorvastatin 20 MG tablet Commonly known as:  LIPITOR Take 1 tablet (20 mg total) by mouth daily.  CENTRUM PO Take 1 tablet by mouth daily.   dapagliflozin propanediol 10 MG Tabs tablet Commonly known as:  FARXIGA Take 10 mg by mouth daily.   levocetirizine 5 MG tablet Commonly known as:  XYZAL Take 1 tablet (5 mg total) by mouth daily.   linaGLIPtin-metFORMIN HCl 2.12-998 MG Tabs Take 1 tablet by mouth 2 (two) times daily.   ONE TOUCH ULTRA TEST test strip Generic drug:  glucose blood Reported on 03/16/2016          Objective:   Physical Exam  HENT:  Head:     BP 126/68 (BP Location: Left Arm, Patient Position: Sitting, Cuff Size: Normal)   Pulse 69   Temp 97.7 F (36.5 C) (Oral)   Resp 16   Ht 5\' 9"  (1.753 m)   Wt 224 lb 8 oz (101.8 kg)   SpO2 98%   BMI 33.15 kg/m       General:   Well developed, NAD, see BMI.  HEENT:  Normocephalic . Face symmetric, atraumatic Throat symmetric, normal palpation of the floor of the mouth and gums. Neck: See graphic Axillary areas: No lymph nodes Lungs:  CTA B Normal  respiratory effort, no intercostal retractions, no accessory muscle use. Heart: RRR,  no murmur.  no pretibial edema bilaterally  Abdomen:  Not distended, soft, non-tender. No rebound or rigidity.   Skin: Not pale. Not jaundice Neurologic:  alert & oriented X3.  Speech normal, gait appropriate for age and unassisted Psych--  Cognition and judgment appear intact.  Cooperative with normal attention span and concentration.  Behavior appropriate. No anxious or depressed appearing.  Assessment & Plan:   Assessment Diabetes- saw Dr Buddy Duty from 2013 to 02-2016, back to PCP care  Hyperlipidemia Seasonal allergies  Plan: DM: Good compliance with [place] and Jentadueto.  CBGs ~ 170s, occasionally seems a spike without apparent reason.  Check a A1c, micro.  Feet exam normal.  Recommend to see his eye doctor Anxiety, insomnia, stress: Stress is about the same, he seems to be handling things  better.  He still not sleeping well, tried melatonin with mixed results.  We talk about possibly taking a medication for sleep but he declined it for now Hyperlipidemia: On Lipitor, check a FLP Neck mass: Going on for few months,  exam is confirmatory, no obvious B symptoms or unusual lymphadenopathies.  We will get a CT soft tissue of the neck.  Further advised with results.   Declined a flu shot. RTC 3 months

## 2018-05-27 NOTE — Patient Instructions (Signed)
GO TO THE LAB : Get the blood work     GO TO THE FRONT DESK Schedule your next appointment for a checkup in 3 months  We will schedule a CT of the neck.

## 2018-05-27 NOTE — Progress Notes (Signed)
Pre visit review using our clinic review tool, if applicable. No additional management support is needed unless otherwise documented below in the visit note. 

## 2018-05-28 NOTE — Assessment & Plan Note (Signed)
DM: Good compliance with [place] and Jentadueto.  CBGs ~ 170s, occasionally seems a spike without apparent reason.  Check a A1c, micro.  Feet exam normal.  Recommend to see his eye doctor Anxiety, insomnia, stress: Stress is about the same, he seems to be handling things  better.  He still not sleeping well, tried melatonin with mixed results.  We talk about possibly taking a medication for sleep but he declined it for now Hyperlipidemia: On Lipitor, check a FLP Neck mass: Going on for few months,  exam is confirmatory, no obvious B symptoms or unusual lymphadenopathies.  We will get a CT soft tissue of the neck.  Further advised with results.   Declined a flu shot. RTC 3 months

## 2018-05-30 ENCOUNTER — Telehealth: Payer: Self-pay

## 2018-05-30 ENCOUNTER — Other Ambulatory Visit: Payer: Self-pay

## 2018-05-30 DIAGNOSIS — R221 Localized swelling, mass and lump, neck: Secondary | ICD-10-CM

## 2018-05-30 NOTE — Telephone Encounter (Signed)
Order changed.

## 2018-05-30 NOTE — Telephone Encounter (Signed)
Tillie Rung from [facility] calling to get prior auth for CT with contrast only. Please call [phone] before 2PM today per kendra.

## 2018-05-31 ENCOUNTER — Ambulatory Visit (HOSPITAL_BASED_OUTPATIENT_CLINIC_OR_DEPARTMENT_OTHER)
Admission: RE | Admit: 2018-05-31 | Discharge: 2018-05-31 | Disposition: A | Discharge status: Home / Self Care | Payer: 59 | Source: Ambulatory Visit | Attending: Internal Medicine | Admitting: Internal Medicine

## 2018-05-31 ENCOUNTER — Other Ambulatory Visit: Payer: Self-pay

## 2018-05-31 DIAGNOSIS — R590 Localized enlarged lymph nodes: Secondary | ICD-10-CM | POA: Diagnosis not present

## 2018-05-31 DIAGNOSIS — R221 Localized swelling, mass and lump, neck: Secondary | ICD-10-CM | POA: Diagnosis present

## 2018-05-31 DIAGNOSIS — E119 Type 2 diabetes mellitus without complications: Secondary | ICD-10-CM

## 2018-05-31 DIAGNOSIS — J351 Hypertrophy of tonsils: Secondary | ICD-10-CM | POA: Diagnosis not present

## 2018-05-31 MED ORDER — IOPAMIDOL (ISOVUE-300) INJECTION 61%
100.0000 mL | Freq: Once | INTRAVENOUS | Status: AC | PRN
  Administered 2018-05-31: 80 mL via INTRAVENOUS

## 2018-06-01 ENCOUNTER — Other Ambulatory Visit: Payer: Self-pay | Admitting: Internal Medicine

## 2018-06-06 ENCOUNTER — Telehealth: Payer: Self-pay

## 2018-06-06 NOTE — Telephone Encounter (Signed)
Although the referral is not emergent, I understand the patient is concerned. Please contact ENT see if he can be seen sooner.

## 2018-06-06 NOTE — Telephone Encounter (Signed)
Copied from [facility] [place] [phone]. Topic: General - Other >> Jun 06, 2018  2:13 PM Yvette Rack wrote: Reason for CRM: pt calling stating that he has an appt with  a referral to the [facility] ENT on Friday 18th he would like to get in earlier he states that they had a old number for him that's why the appt was never made earlier please gve him a call back at [phone] he is worried about how soon the referral need to be

## 2018-06-07 NOTE — Telephone Encounter (Signed)
Called the office. Patient has the soonest available appt.

## 2018-06-07 NOTE — Telephone Encounter (Signed)
I spoke with [facility], they will try to accommodate the patient as soon as possible.  I appreciate their help.  They will call the patient.

## 2018-06-08 DIAGNOSIS — C090 Malignant neoplasm of tonsillar fossa: Secondary | ICD-10-CM | POA: Insufficient documentation

## 2018-06-08 DIAGNOSIS — C770 Secondary and unspecified malignant neoplasm of lymph nodes of head, face and neck: Secondary | ICD-10-CM | POA: Insufficient documentation

## 2018-06-17 ENCOUNTER — Other Ambulatory Visit (HOSPITAL_COMMUNITY): Payer: Self-pay | Admitting: Otolaryngology

## 2018-06-17 ENCOUNTER — Encounter: Payer: Self-pay | Admitting: Radiation Oncology

## 2018-06-17 ENCOUNTER — Telehealth: Payer: Self-pay | Admitting: *Deleted

## 2018-06-17 DIAGNOSIS — C099 Malignant neoplasm of tonsil, unspecified: Secondary | ICD-10-CM

## 2018-06-17 NOTE — Telephone Encounter (Signed)
Oncology Nurse Navigator Documentation  Placed introductory call to new referral patient Dustin Goodman.  Introduced myself as the H&N oncology nurse navigator that works with Dr. Isidore Moos to whom he has been referred by Dr. Erik Obey.  We discussed his consultation with Dr. Nicolette Bang earlier this week and plan to discuss TORS relative to findings of PET scheduled for 10/23 at [facility].    He confirmed understanding of referral, questioned the scheduling of 10/29 appt with Dr. Isidore Moos prior to TORS decision.  I explained likely need for post-operative RT, the value of consulting with Dr. Isidore Moos for further discussion.    I explained the purpose of a dental evaluation prior to starting RT, indicated he wd be contacted by [facility] to arrange an appt within a day or so of his appt with Dr. Isidore Moos.   I answered his questions re TORS w and wo XRT vs chemoRT, implication of T46 positivity.  Navigator Needs Assessment . 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  Boss aware of dx, has expressed support. . Support system:  Wife, had 27 yr old dtr. . Transportation: Own car. Marland Kitchen PCP:  Dr. Kathlene November who was instrumental in ordering CT neck and referring to ENT, aware of dx.  Sees Dr. Larose Kells q 3-6 months for [facility] of diabetes. . PCD:  Does not see dentist regularly.  Currently seeing DDS with [facility] re crown, has been encouraged to have cleaning which he has agreed to.  I provided my contact information, encouraged him to call me with questions/concerns.  He voiced appreciation.  Gayleen Orem, RN, BSN Head & Neck Oncology Nurse [place] at [place] [phone]

## 2018-06-22 NOTE — Progress Notes (Signed)
Head and Neck Cancer Location of Tumor / Histology:  06/08/18 FINAL PATHOLOGIC DIAGNOSIS MICROSCOPIC EXAMINATION AND DIAGNOSIS A.LEFT TONSIL, BIOPSY: Squamous cell carcinoma, moderately differentiated. Addendum Comment This addendum is made to document that immunostain with appropriate controls performed in slide A1 show that tumor cells are positive for p16  Patient presented with symptoms of: He presented to his PCP, Dr. Larose Kells on 05/27/18 and reported a several month history of a left neck mass. He then proceeded to have a CT neck ordered and completed on 05/31/18.  Biopsies of left tonsil revealed: squamous cell carcinoma, moderately differentiated.   Nutrition Status Yes No Comments  Weight changes? [x]  []  His weight is fluctuating over about 5-6 lbs  Swallowing concerns? [x]  []    PEG? []  [x]     Referrals Yes No Comments  Social Work? []  [x]    Dentistry? []  [x]    Swallowing therapy? []  [x]    Nutrition? []  []    Med/Onc? [x]  []  Dr. Maylon Peppers- cancelled until after surgery   Safety Issues Yes No Comments  Prior radiation? []  [x]    Pacemaker/ICD? []  [x]    Possible current pregnancy? []  [x]    Is the patient on methotrexate? []  [x]     Tobacco/Marijuana/Snuff/ETOH use: No drug use. He has never smoked. He drinks alcohol rarely.   Past/Anticipated interventions by otolaryngology, if any:  Dr. Erik Obey  Dr. Nicolette Bang plans surgery this Monday.   Past/Anticipated interventions by medical oncology, if any:  Will hold off until after surgery.    Current Complaints / other details:    BP 131/80   Pulse 71   Ht 5\' 9"  (1.753 m)   Wt 226 lb (102.5 kg)   SpO2 100%   BMI 33.37 kg/m    Wt Readings from Last 3 Encounters:  06/28/18 226 lb (102.5 kg)  05/27/18 224 lb 8 oz (101.8 kg)  02/03/18 227 lb (103 kg)

## 2018-06-23 ENCOUNTER — Other Ambulatory Visit: Payer: Self-pay | Admitting: Radiation Oncology

## 2018-06-23 ENCOUNTER — Ambulatory Visit
Admission: RE | Admit: 2018-06-23 | Discharge: 2018-06-23 | Disposition: A | Discharge status: Home / Self Care | Payer: Self-pay | Source: Ambulatory Visit | Attending: Radiation Oncology | Admitting: Radiation Oncology

## 2018-06-23 DIAGNOSIS — C099 Malignant neoplasm of tonsil, unspecified: Secondary | ICD-10-CM

## 2018-06-24 ENCOUNTER — Other Ambulatory Visit: Payer: Self-pay | Admitting: *Deleted

## 2018-06-24 ENCOUNTER — Ambulatory Visit
Admission: RE | Admit: 2018-06-24 | Discharge: 2018-06-24 | Disposition: A | Discharge status: Home / Self Care | Payer: Self-pay | Source: Ambulatory Visit | Attending: Radiation Oncology | Admitting: Radiation Oncology

## 2018-06-24 DIAGNOSIS — C099 Malignant neoplasm of tonsil, unspecified: Secondary | ICD-10-CM

## 2018-06-27 ENCOUNTER — Other Ambulatory Visit: Payer: Self-pay | Admitting: Otolaryngology

## 2018-06-27 ENCOUNTER — Telehealth: Payer: Self-pay | Admitting: *Deleted

## 2018-06-27 NOTE — Telephone Encounter (Signed)
Oncology Nurse Navigator Documentation  Called Mr. Licausi for update and to confirm appts. He stated:  Dr. Nicolette Bang, [facility], spoke with him last week s/p 10/23 PET, indicated disease localized, he is a good TORS candidate.  Pt agreed to move forward with surgery which has been scheduled for next Monday, 07/04/18.  He has appt tomorrow afternoon with Dr. Nicolette Bang to discuss further.  He wishes to keep tomorrow's appt with Dr. Isidore Moos, [facility], as he understands in talking with Dr. Nicolette Bang there's a good chance he may need post-surgical RT.  Asked that appt with [facility] be cancelled, will reschedule as necessary.  Agreed to move forward with pre-radiotherapy dental evaluation. Drs. Erasmo Downer and [facility] updated.  Gayleen Orem, RN, BSN Head & Neck Oncology Nurse [place] at [place] [phone]

## 2018-06-28 ENCOUNTER — Encounter: Payer: Self-pay | Admitting: *Deleted

## 2018-06-28 ENCOUNTER — Ambulatory Visit
Admission: RE | Admit: 2018-06-28 | Discharge: 2018-06-28 | Disposition: A | Discharge status: Home / Self Care | Payer: 59 | Source: Ambulatory Visit | Attending: Radiation Oncology | Admitting: Radiation Oncology

## 2018-06-28 ENCOUNTER — Encounter: Payer: Self-pay | Admitting: Radiation Oncology

## 2018-06-28 ENCOUNTER — Encounter (HOSPITAL_COMMUNITY): Payer: Self-pay | Admitting: Dentistry

## 2018-06-28 ENCOUNTER — Other Ambulatory Visit: Payer: Self-pay

## 2018-06-28 ENCOUNTER — Ambulatory Visit (HOSPITAL_COMMUNITY): Payer: Self-pay | Admitting: Dentistry

## 2018-06-28 VITALS — BP 131/80 | HR 71 | Temp 98.2°F

## 2018-06-28 VITALS — BP 131/80 | HR 71 | Ht 69.0 in | Wt 226.0 lb

## 2018-06-28 DIAGNOSIS — Z79899 Other long term (current) drug therapy: Secondary | ICD-10-CM | POA: Insufficient documentation

## 2018-06-28 DIAGNOSIS — C099 Malignant neoplasm of tonsil, unspecified: Secondary | ICD-10-CM

## 2018-06-28 DIAGNOSIS — K0530 Chronic periodontitis, unspecified: Secondary | ICD-10-CM

## 2018-06-28 DIAGNOSIS — K06010 Localized gingival recession, unspecified: Secondary | ICD-10-CM

## 2018-06-28 DIAGNOSIS — K08409 Partial loss of teeth, unspecified cause, unspecified class: Secondary | ICD-10-CM

## 2018-06-28 DIAGNOSIS — Z01818 Encounter for other preprocedural examination: Secondary | ICD-10-CM | POA: Diagnosis not present

## 2018-06-28 DIAGNOSIS — C090 Malignant neoplasm of tonsillar fossa: Secondary | ICD-10-CM

## 2018-06-28 DIAGNOSIS — K030 Excessive attrition of teeth: Secondary | ICD-10-CM

## 2018-06-28 DIAGNOSIS — M2632 Excessive spacing of fully erupted teeth: Secondary | ICD-10-CM

## 2018-06-28 DIAGNOSIS — K036 Deposits [accretions] on teeth: Secondary | ICD-10-CM

## 2018-06-28 MED ORDER — SODIUM FLUORIDE 1.1 % DT CREA: TOPICAL_CREAM | DENTAL | 99 refills | Status: DC

## 2018-06-28 NOTE — Progress Notes (Signed)
Radiation Oncology         [phone] ________________________________  Initial Outpatient Consultation  Name: Dustin Goodman MRN: [id number]  Date: 06/28/2018  DOB: 07/13/1966  CC:Paz, Alda Berthold, MD  Jodi Marble, MD   REFERRING PHYSICIAN: Jodi Marble, MD  DIAGNOSIS:    ICD-10-CM   1. 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 Malignant neoplasm of tonsillar fossa (HCC) C09.0    Cancer Staging Cancer of tonsillar fossa (HCC) Staging form: Pharynx - HPV-Mediated Oropharynx, AJCC 8th Edition - Clinical: Stage I (cT1, cN1, cM0, p16+) - Signed by Eppie Gibson, MD on 06/28/2018   CHIEF COMPLAINT: Here to discuss management of tonsil cancer  HISTORY OF PRESENT ILLNESS::Dustin Goodman is a 52 y.o. male who presented to his PCP, Dr. Larose Kells, on 05/27/2018 with a left neck mass that had been present since April 2019. He was evaluated with a CT of the neck on 05/31/2018 which showed an enlarged left level 2 lymph node and mild enlargement of the left tonsil.  Subsequently, the patient saw Dr. Erik Obey who performed a biopsy.  Biopsy of the left tonsil on 06/08/2018 revealed: Squamous cell carcinoma, moderately differentiated, p16 positive [facility] path). Per [facility] path, this was poorly differentiated.   Pertinent imaging thus far includes PET scan performed on 06/22/2018 revealing left tonsillar neoplasm with left level 2 lymph nodal metastasis.   No evidence of distant metastases.   I have reviewed his imaging personally.  The patient reviewed these results with Dr. Erik Obey and has been referred today for discussion of potential radiation treatment options. He is accompanied by his wife. We are also joined in consultation by Gayleen Orem, RN, our Head and Neck Navigator. The patient has met with Dr. Nicolette Bang who plans for TORS next Monday, 07/04/2018.  Swallowing issues, if any: He denies any current swallowing issues but states that he is concerned about  having long-term swallowing issues related to treatment.  Weight Changes: He reports his weight is fluctuating over about 5-6 pounds. Wt Readings from Last 3 Encounters:  06/28/18 226 lb (102.5 kg)  05/27/18 224 lb 8 oz (101.8 kg)  02/03/18 227 lb (103 kg)   Pain status: He denies any pain.  Other symptoms: He denies any other symptoms.  Tobacco history, if any: He denies any tobacco or drug use.  ETOH abuse, if any: He drinks alcohol rarely.  Prior cancers, if any: He denies.  PREVIOUS RADIATION THERAPY: No  PAST MEDICAL HISTORY:  has a past medical history of Allergy, Diabetes mellitus (1998), and Hyperlipidemia.    PAST SURGICAL HISTORY: Past Surgical History:  Procedure Laterality Date  . CHOLECYSTECTOMY  1998  . SHOULDER ARTHROSCOPY Right 2010    Dr Onnie Graham     FAMILY HISTORY: family history includes Breast cancer in his mother; Cancer in his maternal aunt; Diabetes in his maternal grandmother and maternal uncle.  SOCIAL HISTORY:  reports that he has never smoked. He has never used smokeless tobacco. He reports that he drinks alcohol. He reports that he does not use drugs. Patient resides in [place] with his wife. Grew up in [place] and moved to [place] at age 46.  ALLERGIES: Dust mite extract  MEDICATIONS:  Current Outpatient Medications  Medication Sig Dispense Refill  . atorvastatin (LIPITOR) 20 MG tablet Take 1 tablet (20 mg total) by mouth daily. 90 tablet 1  . Dapagliflozin-metFORMIN HCl ER (XIGDUO XR) 12-998 MG TB24 Take 2 tablets by mouth daily with breakfast.    .  glucose blood (ONE TOUCH ULTRA TEST) test strip Reported on 03/16/2016    . levocetirizine (XYZAL) 5 MG tablet Take 1 tablet (5 mg total) by mouth daily. 90 tablet 3  . Multiple Vitamins-Minerals (CENTRUM PO) Take 1 tablet by mouth daily.      . sodium fluoride (PREVIDENT 5000 PLUS) 1.1 % CREA dental cream Apply to tooth brush. Brush teeth for 2 minutes. Spit out excess. DO NOT rinse afterwards. Repeat  nightly. 1 Tube prn   No current facility-administered medications for this encounter.     REVIEW OF SYSTEMS:  A 10+ POINT REVIEW OF SYSTEMS WAS OBTAINED including neurology, dermatology, psychiatry, cardiac, respiratory, lymph, extremities, GI, GU, Musculoskeletal, constitutional, breasts, reproductive, HEENT.  All pertinent positives are noted in the HPI.  All others are negative.   PHYSICAL EXAM:  height is '5\' 9"'$  (1.753 m) and weight is 226 lb (102.5 kg). His blood pressure is 131/80 and his pulse is 71. His oxygen saturation is 100%.   General: Alert and oriented, in no acute distress. HEENT: Head is normocephalic. Extraocular movements are intact. Oropharynx is notable for a very subtle exudative mass arising from the left tonsil. Otherwise oral cavity and oropharynx are clear. Neck: Neck is notable for a palpable bulging mass at the angle of the left mandible which is approximately 5 cm in size. No other palpable masses. Heart: Regular in rate and rhythm with no murmurs, rubs, or gallops. Chest: Clear to auscultation bilaterally, with no rhonchi, wheezes, or rales. Abdomen: Soft, nontender, nondistended, with no rigidity or guarding. Extremities: No cyanosis or edema. Lymphatics: see Neck Exam Skin: No concerning lesions. Musculoskeletal: Symmetric strength and muscle tone throughout. Neurologic: Cranial nerves II through XII are grossly intact. No obvious focalities. Speech is fluent. Coordination is intact. Psychiatric: Judgment and insight are intact. Affect is appropriate.   ECOG = 0  0 - Asymptomatic (Fully active, able to carry on all predisease activities without restriction)  1 - Symptomatic but completely ambulatory (Restricted in physically strenuous activity but ambulatory and able to carry out work of a light or sedentary nature. For example, light housework, office work)  2 - Symptomatic, <50% in bed during the day (Ambulatory and capable of all self care but unable to  carry out any work activities. Up and about more than 50% of waking hours)  3 - Symptomatic, >50% in bed, but not bedbound (Capable of only limited self-care, confined to bed or chair 50% or more of waking hours)  4 - Bedbound (Completely disabled. Cannot carry on any self-care. Totally confined to bed or chair)  5 - Death   Eustace Pen MM, Creech RH, Tormey DC, et al. [phone]). "Toxicity and response criteria of the [facility]". [place] Oncol. 5 (6): 649-55   LABORATORY DATA:  Lab Results  Component Value Date   WBC 6.7 05/27/2018   HGB 15.8 05/27/2018   HCT 45.3 05/27/2018   MCV 87.8 05/27/2018   PLT 256.0 05/27/2018   CMP     Component Value Date/Time   NA 139 05/27/2018 0910   NA 140 09/28/2014   K 4.5 05/27/2018 0910   CL 102 05/27/2018 0910   CO2 30 05/27/2018 0910   GLUCOSE 148 (H) 05/27/2018 0910   BUN 13 05/27/2018 0910   BUN 10 09/28/2014   CREATININE 0.74 05/27/2018 0910   CREATININE 0.72 10/09/2016 1440   CALCIUM 9.3 05/27/2018 0910   PROT 7.0 05/27/2018 0910   ALBUMIN 4.5 05/27/2018 0910  AST 14 05/27/2018 0910   ALT 16 05/27/2018 0910   ALKPHOS 74 05/27/2018 0910   BILITOT 1.1 05/27/2018 0910   GFRNONAA 131.72 12/17/2009 0000   GFRAA 161 09/13/2008 1137      Lab Results  Component Value Date   TSH 1.16 05/18/2017     RADIOGRAPHY: Ct Soft Tissue Neck W Contrast  Result Date: 05/31/2018 CLINICAL DATA:  Neck mass.  Diabetes hyperlipidemia.  Afebrile EXAM: CT NECK WITH CONTRAST TECHNIQUE: Multidetector CT imaging of the neck was performed using the standard protocol following the bolus administration of intravenous contrast. CONTRAST:  65m ISOVUE-300 IOPAMIDOL (ISOVUE-300) INJECTION 61% COMPARISON:  None. FINDINGS: Pharynx and larynx: Mild asymmetric enlargement left tonsil with ulceration of the mucosal surface. Possible mass lesion versus chronic tonsillitis. Tongue base normal. Normal larynx. Salivary glands: No inflammation, mass,  or stone. Thyroid: Normal Lymph nodes: Left level 2 lymph node is enlarged measuring 3 x 3.4 cm. Internal cystic changes present suggesting necrosis and malignancy. No enlarged lymph nodes in the right neck. Vascular: Negative Limited intracranial: Negative Visualized orbits: Negative Mastoids and visualized paranasal sinuses: Mucous retention cyst left maxillary sinus. Mild mucosal edema right maxillary sinus. Skeleton: Cervical spondylosis. Posterior spurring on the left at C3-4. No acute skeletal abnormality. Upper chest: Lung apices clear bilaterally. Other: None IMPRESSION: Enlarged left level 2 lymph node. The appearance is consistent with malignant lymph node and biopsy recommended. Mild enlargement left tonsil may represent tumor or chronic inflammation. Direct visualization recommended. Electronically Signed   By: CFranchot GalloM.D.   On: 05/31/2018 16:11      IMPRESSION/PLAN: This is a delightful patient with head and neck cancer. He will undergo TORS with Dr. WNicolette Bangon Monday, 11/4. We discussed some of the potential indications for postoperative radiotherapy. If indicated, I may recommend 6-6.5 weeks of adjuvant radiotherapy for this patient.  We discussed the potential risks, benefits, and side effects of radiotherapy. We talked in detail about acute and late effects. We discussed that some of the most bothersome acute effects may be mucositis, dysgeusia, salivary changes, skin irritation, hair loss, dehydration, weight loss and fatigue. We talked about late effects which include but are not necessarily limited to dysphagia, hypothyroidism, nerve injury, spinal cord injury, xerostomia, trismus, and neck edema. We discussed using baking soda rinses as needed to soothe/cleanse mouth and throat after surgery and during radiotherapy. No guarantees of treatment were given. A consent form was signed and placed in the patient's medical record. The patient is enthusiastic about proceeding with  treatment. The patient will proceed with TORS at this time. I will await his referral back to me for eventual CT simulation/treatment planning if indicated.  We also discussed that the treatment of head and neck cancer is a multidisciplinary process to maximize treatment outcomes and quality of life. For this reason the following referrals have been or will be made:  1. Medical oncology to discuss chemotherapy - will hold off until after surgery.  2. Dentistry for dental evaluation, possible extractions in the radiation fields, and /or advice on reducing risk of cavities, osteoradionecrosis, or other oral issues. Patient met with Dr. KEnrique Sackearlier today with a good report; states that all he needs is a cleaning before surgery.  3. Nutritionist for nutrition support during and after treatment - will hold off until after surgery.   4. Speech language pathology for swallowing and/or speech therapy -will hold off until after surgery.   5. Social work for social support - will hold off  until after surgery.   6. Physical therapy due to risk of lymphedema in neck and deconditioning -will hold off until after surgery.   7. Financial counseling -will hold off until after surgery.   __________________________________________   Eppie Gibson, MD  This document serves as a record of services personally performed by Eppie Gibson, MD. It was created on her behalf by Rae Lips, a trained medical scribe. The creation of this record is based on the scribe's personal observations and the provider's statements to them. This document has been checked and approved by the attending provider.

## 2018-06-28 NOTE — Progress Notes (Signed)
DENTAL CONSULTATION  Date of Consultation:  06/28/2018 Patient Name:   Dustin Goodman Date of Birth:   11/25/1965 Medical Record Number: [id number]  VITALS: BP 131/80 (BP Location: Left Arm)   Pulse 71   Temp 98.2 F (36.8 C)   CHIEF COMPLAINT: Patient referred by Dr. Erik Obey for a dental consultation.  HPI: Dustin Goodman s a 52 year old male recently diagnosed with squamous cell carcinoma of the left tonsil. Patient with anticipated TORS procedure followed by radiation therapy and possible chemotherapy as needed.  Patient is now seen as part of a medically necessary pre-chemoradiation therapy dental protocol examination.  The patient currently denies acute toothaches, swellings, or abscesses. Patient was last seen in September 2019 by Dr. Ysidro Evert at [facility] for cementation of a crown on tooth #3.  Last dental cleaning was approximately 3 years ago. Patient denies having any partial dentures. Patient denies any dental phobia.  PROBLEM LIST: Patient Active Problem List   Diagnosis Date Noted  . 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 Tonsil cancer ([place]) 06/08/2018  . Anxiety 02/03/2018  . Insomnia 02/03/2018  . PCP NOTES >>>>>>>>>>>>>>>>>. 03/17/2016  . Erectile dysfunction 10/25/2014  . Irritability and anger 03/09/2012  . General medical examination 11/26/2011  . Back pain 06/04/2010  . High cholesterol 09/13/2009  . ALLERGIC RHINITIS DUE TO POLLEN 01/14/2009  . ALLERGIC URTICARIA 01/14/2009  . ACHILLES TENDINITIS 07/19/2007  . DMII (diabetes mellitus, type 2) ([place]) 01/13/2007    PMH: Past Medical History:  Diagnosis Date  . Allergy    seasonal  . Diabetes mellitus 1998   type II  . Hyperlipidemia     PSH: Past Surgical History:  Procedure Laterality Date  . CHOLECYSTECTOMY  1998  . SHOULDER ARTHROSCOPY Right 2010    Dr Onnie Graham     ALLERGIES: Allergies  Allergen Reactions  . Dust Mite Extract Hives    Takes Xyzal     MEDICATIONS: Current Outpatient Medications  Medication Sig Dispense Refill  . atorvastatin (LIPITOR) 20 MG tablet Take 1 tablet (20 mg total) by mouth daily. 90 tablet 1  . dapagliflozin propanediol (FARXIGA) 10 MG TABS tablet Take 10 mg by mouth daily. 90 tablet 1  . glucose blood (ONE TOUCH ULTRA TEST) test strip Reported on 03/16/2016    . levocetirizine (XYZAL) 5 MG tablet Take 1 tablet (5 mg total) by mouth daily. 90 tablet 3  . linaGLIPtin-metFORMIN HCl (JENTADUETO) 2.12-998 MG TABS Take 1 tablet by mouth 2 (two) times daily. 180 tablet 1  . Multiple Vitamins-Minerals (CENTRUM PO) Take 1 tablet by mouth daily.       No current facility-administered medications for this visit.     LABS: Lab Results  Component Value Date   WBC 6.7 05/27/2018   HGB 15.8 05/27/2018   HCT 45.3 05/27/2018   MCV 87.8 05/27/2018   PLT 256.0 05/27/2018      Component Value Date/Time   NA 139 05/27/2018 0910   NA 140 09/28/2014   K 4.5 05/27/2018 0910   CL 102 05/27/2018 0910   CO2 30 05/27/2018 0910   GLUCOSE 148 (H) 05/27/2018 0910   BUN 13 05/27/2018 0910   BUN 10 09/28/2014   CREATININE 0.74 05/27/2018 0910   CREATININE 0.72 10/09/2016 1440   CALCIUM 9.3 05/27/2018 0910   GFRNONAA 131.72 12/17/2009 0000   GFRAA 161 09/13/2008 1137   No results found for: INR, PROTIME No results found for: PTT  SOCIAL HISTORY: Social History   Socioeconomic  History  . Marital status: Married    Spouse name: Not on file  . Number of children: 1  . Years of education: Not on file  . Highest education level: Not on file  Occupational History  . Occupation: Medical sales representative    Employer: [employer]  Social Needs  . Financial resource strain: Not on file  . Food insecurity:    Worry: Not on file    Inability: Not on file  . Transportation needs:    Medical: Not on file    Non-medical: Not on file  Tobacco Use  . Smoking status: Never Smoker  . Smokeless tobacco: Never Used   Substance and Sexual Activity  . Alcohol use: Yes    Comment: rarely  . Drug use: No  . Sexual activity: Not on file  Lifestyle  . Physical activity:    Days per week: Not on file    Minutes per session: Not on file  . Stress: Not on file  Relationships  . Social connections:    Talks on phone: Not on file    Gets together: Not on file    Attends religious service: Not on file    Active member of club or organization: Not on file    Attends meetings of clubs or organizations: Not on file    Relationship status: Not on file  . Intimate partner violence:    Fear of current or ex partner: Not on file    Emotionally abused: Not on file    Physically abused: Not on file    Forced sexual activity: Not on file  Other Topics Concern  . Not on file  Social History Narrative   Daughter 2010    FAMILY HISTORY: Family History  Problem Relation Age of Onset  . Cancer Maternal Aunt        breast  . Diabetes Maternal Uncle   . Diabetes Maternal Grandmother   . Breast cancer Mother   . Colon cancer Neg Hx   . Prostate cancer Neg Hx   . CAD Neg Hx     REVIEW OF SYSTEMS: Reviewed with the patient as per History of present illness. Psych: Patient denies having dental phobia.  DENTAL HISTORY: CHIEF COMPLAINT: Patient referred by Dr. Erik Obey for a dental consultation.  HPI: Dustin Goodman s a 52 year old male recently diagnosed with squamous cell carcinoma of the left tonsil. Patient with anticipated TORS procedure followed by radiation therapy and possible chemotherapy as needed.  Patient is now seen as part of a medically necessary pre-chemoradiation therapy dental protocol examination.  The patient currently denies acute toothaches, swellings, or abscesses. Patient was last seen in September 2019 by Dr. Ysidro Evert at [facility] for cementation of a crown on tooth #3.  Last dental cleaning was approximately 3 years ago. Patient denies having any partial dentures. Patient denies  any dental phobia.  DENTAL EXAMINATION: GENERAL:  The patient is a well-developed, well-nourished male in no acute distress. HEAD AND NECK:  Patient has left neck lymphadenopathy. The patient denies acute TMJ symptoms. The patient has maximum interincisal opening of 50 mm. INTRAORAL EXAM:  The patient has normal saliva. The patient has a deep palatal vault. There is no evidence of oral abscess formation. DENTITION: The patient is missing wisdom teeth numbers 1, 16, 17, and 32. There is evidence of maxillary and mandibular anterior interincisal attrition PERIODONTAL:  A patient has chronic periodontitis with plaque and calculus accumulations, gingival recession, and incipient to moderate bone loss. DENTAL  CARIES/SUBOPTIMAL RESTORATIONS: No obvious dental caries are noted. ENDODONTIC:  The patient currently denies acute pulpitis symptoms. I do not see any evidence of periapical pathology or radiolucency. The patient has had previous root canal therapy on tooth #14 with no obvious persistent periapical pathology or symptoms. CROWN AND BRIDGE:  The patient has crown the tooth numbers 3, 14, and 18. PROSTHODONTIC: There are no partial dentures. ORTHODONTICS: The patient is status post orthodontic therapy. The patient has part of a fractured dumbbell retainer on the lingual of #9. OCCLUSION: The patient has a poor occlusal scheme but a stable occlusion. Multiple diastemas are noted. Many additional diastemas have been closed with resin restorations.  RADIOGRAPHIC INTERPRETATION: An orthopantogram was taken and supplemented with a full series of dental radiographs. Patient is missing tooth numbers 1, 16, 17, and 32. There is incipent to moderate bone loss noted. Patient has crowns on tooth numbers 3, 14, and 18. Patient has a previous root canal therapy on tooth #14. Patient has multiple dental resin and amalgam restorations. Multiple diastemas are noted.  ASSESSMENTS: 1. Squamous cell carcinoma of  the left tonsil 2. Anticipated TORS procedure followed by radiation therapy and possible chemotherapy 3. Chronic periodontitis of bone loss 4. Accretions 5. Gingival recession 6. Missing tooth numbers 1, 16, 17, and 32 7. Multiple diastemas 8. Poor occlusal scheme but a stable occlusion 9. Maxillary and mandibular anterior incisal attrition   PLAN/RECOMMENDATIONS: 1. I discussed the risks, benefits, and complications of various treatment options with the patient in relationship to his medical and dental conditions, anticipated TORS procedure followed by radiation therapy and possible chemotherapy, and chemoradiation therapy side effects to include xerostomia, radiation caries, trismus, mucositis, taste changes, gum and jawbone changes, and risk for infection and osteoradionecrosis. We discussed various treatment options to include no treatment, extraction of teeth in the primary field radiation therapy, alveoloplasty, pre-prosthetic surgery as indicated, periodontal therapy, dental restorations, root canal therapy, crown and bridge therapy, implant therapy, and replacement of missing teeth as indicated. Currently,per review of the anticipated ports drawing and doses, there are no teeth in the primary field radiation therapy.  Therefore, no teeth should need to be extracted. However if the anticipated ports and doses changes after the surgery, I will again discuss extraction of teeth in the primary field of radiation therapy at a dose greater than 5000 cGy.The patient currently wishes to proceed with impressions today for the fabrication of upper and lower fluoride trays and scatter protection devices. Prescription for PreviDent 5000 sodium fluoride was provided the patient with refills for one year. The patient is using at bedtime as instructed. Dr. Ysidro Evert will be contacted at [facility] to arrange for dental cleaning hopefully prior to anticipated TORS surgery currently scheduled for Monday,  07/04/2018, at [facility].  2. Discussion of findings with medical team and coordination of future medical and dental care as needed.  I spent in excess of 120 minutes during the conduct of this consultation and >50% of this time involved direct face-to-face encounter for counseling and/or coordination of the patient's care.    Lenn Cal, DDS

## 2018-06-28 NOTE — Patient Instructions (Signed)

## 2018-06-29 ENCOUNTER — Ambulatory Visit: Payer: 59 | Admitting: Hematology

## 2018-06-29 NOTE — Progress Notes (Signed)
Oncology Nurse Navigator Documentation  Met with patient during initial consult with Dr. Isidore Moos.  He was accompanied by his wife.    . Further introduced myself as his Navigator, explained my role as a member of the Care Team.   . Provided introductory explanation of radiation treatment including SIM planning and purpose of Aquaplast head and shoulder mask, showed them example.   . Provided and discussed education handout for PEG.  . Of note: Marland Kitchen He is schedule for TORS 11/4 with Dr. Nicolette Bang, [facility], is seeing Dr. Nicolette Bang this afternoon for further discussion. Marland Kitchen He completed pre-radiotherapy dental eval with Dr. Enrique Sack prior to this appt, no extractions recommended. . I explained I will be tracking his surgical progress and coordinate return to [facility] for adjuvant tmts as needed. . I encouraged them to contact me with questions/concerns.  They verbalized understanding of information provided.    Gayleen Orem, RN, BSN Head & Neck Oncology Nurse [place] at [place] [phone]

## 2018-07-01 HISTORY — PX: NECK DISSECTION: SUR422

## 2018-07-06 MED ORDER — DOCUSATE SODIUM 100 MG PO CAPS
100.00 | ORAL_CAPSULE | ORAL | Status: DC
Start: 2018-07-06 — End: 2018-07-06

## 2018-07-06 MED ORDER — SODIUM CHLORIDE FLUSH 0.9 % IV SOLN
10.00 | INTRAVENOUS | Status: DC
Start: ? — End: 2018-07-06

## 2018-07-06 MED ORDER — ENOXAPARIN SODIUM 40 MG/0.4ML ~~LOC~~ SOLN
40.00 | SUBCUTANEOUS | Status: DC
Start: 2018-07-07 — End: 2018-07-06

## 2018-07-06 MED ORDER — POLYETHYLENE GLYCOL 3350 17 G PO PACK
17.00 | PACK | ORAL | Status: DC
Start: 2018-07-07 — End: 2018-07-06

## 2018-07-06 MED ORDER — OXYCODONE HCL 5 MG/5ML PO SOLN
5.00 | ORAL | Status: DC
Start: ? — End: 2018-07-06

## 2018-07-06 MED ORDER — BACITRACIN ZINC 500 UNIT/GM EX OINT
TOPICAL_OINTMENT | CUTANEOUS | Status: DC
Start: 2018-07-06 — End: 2018-07-06

## 2018-07-06 MED ORDER — DEXTROSE 10 % IV SOLN
125.00 | INTRAVENOUS | Status: DC
Start: ? — End: 2018-07-06

## 2018-07-06 MED ORDER — ACETAMINOPHEN 650 MG/20.3ML PO SOLN
1000.00 | ORAL | Status: DC
Start: 2018-07-06 — End: 2018-07-06

## 2018-07-06 MED ORDER — INSULIN LISPRO 100 UNIT/ML ~~LOC~~ SOLN
3.00 | SUBCUTANEOUS | Status: DC
Start: 2018-07-06 — End: 2018-07-06

## 2018-07-06 MED ORDER — ONDANSETRON HCL 4 MG/2ML IJ SOLN
4.00 | INTRAMUSCULAR | Status: DC
Start: ? — End: 2018-07-06

## 2018-07-06 MED ORDER — SODIUM CHLORIDE FLUSH 0.9 % IV SOLN
10.00 | INTRAVENOUS | Status: DC
Start: 2018-07-06 — End: 2018-07-06

## 2018-07-11 ENCOUNTER — Telehealth: Payer: Self-pay | Admitting: Internal Medicine

## 2018-07-11 NOTE — Telephone Encounter (Signed)
The patient had recent surgery for cancer of the tonsil, he is now at home.   I spoke w/ his wife reports that he is a still in pain, not eating much. They are forcing fluids. He is having a hard time swallowing his DM medications. Recommend to continue pushing fluids, call endocrinology and see if some of the medications that he is taking can be held since he is not eating well. If he gets worse and he is getting dehydrated, okay to go to the ER for IV fluids and evaluation.

## 2018-07-12 ENCOUNTER — Emergency Department (HOSPITAL_COMMUNITY): Payer: 59

## 2018-07-12 ENCOUNTER — Inpatient Hospital Stay (HOSPITAL_COMMUNITY)
Admission: EM | Admit: 2018-07-12 | Discharge: 2018-07-15 | DRG: 638 | Disposition: A | Discharge status: Home / Self Care | Payer: 59 | Attending: Internal Medicine | Admitting: Internal Medicine

## 2018-07-12 ENCOUNTER — Encounter (HOSPITAL_COMMUNITY): Payer: Self-pay

## 2018-07-12 ENCOUNTER — Other Ambulatory Visit: Payer: Self-pay

## 2018-07-12 DIAGNOSIS — Z9089 Acquired absence of other organs: Secondary | ICD-10-CM

## 2018-07-12 DIAGNOSIS — E785 Hyperlipidemia, unspecified: Secondary | ICD-10-CM | POA: Diagnosis present

## 2018-07-12 DIAGNOSIS — Z803 Family history of malignant neoplasm of breast: Secondary | ICD-10-CM | POA: Diagnosis not present

## 2018-07-12 DIAGNOSIS — R633 Feeding difficulties: Secondary | ICD-10-CM | POA: Diagnosis present

## 2018-07-12 DIAGNOSIS — Z9049 Acquired absence of other specified parts of digestive tract: Secondary | ICD-10-CM

## 2018-07-12 DIAGNOSIS — E878 Other disorders of electrolyte and fluid balance, not elsewhere classified: Secondary | ICD-10-CM | POA: Diagnosis present

## 2018-07-12 DIAGNOSIS — E7800 Pure hypercholesterolemia, unspecified: Secondary | ICD-10-CM | POA: Diagnosis not present

## 2018-07-12 DIAGNOSIS — C099 Malignant neoplasm of tonsil, unspecified: Secondary | ICD-10-CM | POA: Diagnosis present

## 2018-07-12 DIAGNOSIS — R531 Weakness: Secondary | ICD-10-CM | POA: Diagnosis not present

## 2018-07-12 DIAGNOSIS — E119 Type 2 diabetes mellitus without complications: Secondary | ICD-10-CM

## 2018-07-12 DIAGNOSIS — Z833 Family history of diabetes mellitus: Secondary | ICD-10-CM

## 2018-07-12 DIAGNOSIS — E870 Hyperosmolality and hypernatremia: Secondary | ICD-10-CM | POA: Diagnosis not present

## 2018-07-12 DIAGNOSIS — Z886 Allergy status to analgesic agent status: Secondary | ICD-10-CM

## 2018-07-12 DIAGNOSIS — E860 Dehydration: Secondary | ICD-10-CM | POA: Diagnosis not present

## 2018-07-12 DIAGNOSIS — N179 Acute kidney failure, unspecified: Secondary | ICD-10-CM

## 2018-07-12 DIAGNOSIS — Z794 Long term (current) use of insulin: Secondary | ICD-10-CM

## 2018-07-12 DIAGNOSIS — E872 Acidosis, unspecified: Secondary | ICD-10-CM

## 2018-07-12 DIAGNOSIS — E876 Hypokalemia: Secondary | ICD-10-CM | POA: Diagnosis present

## 2018-07-12 DIAGNOSIS — R1310 Dysphagia, unspecified: Secondary | ICD-10-CM | POA: Diagnosis present

## 2018-07-12 DIAGNOSIS — Z91048 Other nonmedicinal substance allergy status: Secondary | ICD-10-CM

## 2018-07-12 DIAGNOSIS — E1110 Type 2 diabetes mellitus with ketoacidosis without coma: Secondary | ICD-10-CM | POA: Diagnosis not present

## 2018-07-12 LAB — CBG MONITORING, ED
GLUCOSE-CAPILLARY: 349 mg/dL — AB (ref 70–99)
GLUCOSE-CAPILLARY: 201 mg/dL — AB (ref 70–99)
GLUCOSE-CAPILLARY: 171 mg/dL — AB (ref 70–99)
Glucose-Capillary: 313 mg/dL — ABNORMAL HIGH (ref 70–99)
Glucose-Capillary: 280 mg/dL — ABNORMAL HIGH (ref 70–99)
Glucose-Capillary: 242 mg/dL — ABNORMAL HIGH (ref 70–99)
Glucose-Capillary: 217 mg/dL — ABNORMAL HIGH (ref 70–99)
Glucose-Capillary: 188 mg/dL — ABNORMAL HIGH (ref 70–99)
Glucose-Capillary: 176 mg/dL — ABNORMAL HIGH (ref 70–99)

## 2018-07-12 LAB — COMPREHENSIVE METABOLIC PANEL
ALT: 21 U/L (ref 0–44)
AST: 13 U/L — ABNORMAL LOW (ref 15–41)
Albumin: 4.2 g/dL (ref 3.5–5.0)
Alkaline Phosphatase: 80 U/L (ref 38–126)
Anion gap: 23 — ABNORMAL HIGH (ref 5–15)
BUN: 82 mg/dL — AB (ref 6–20)
CHLORIDE: 112 mmol/L — AB (ref 98–111)
CO2: 14 mmol/L — AB (ref 22–32)
CREATININE: 1.54 mg/dL — AB (ref 0.61–1.24)
Calcium: 10.3 mg/dL (ref 8.9–10.3)
GFR calc Af Amer: 59 mL/min — ABNORMAL LOW (ref >60)
GFR calc non Af Amer: 51 mL/min — ABNORMAL LOW (ref >60)
Glucose, Bld: 602 mg/dL (ref 70–99)
Potassium: 4 mmol/L (ref 3.5–5.1)
SODIUM: 149 mmol/L — AB (ref 135–145)
Total Bilirubin: 2.8 mg/dL — ABNORMAL HIGH (ref 0.3–1.2)
Total Protein: 7.5 g/dL (ref 6.5–8.1)

## 2018-07-12 LAB — CBC WITH DIFFERENTIAL/PLATELET
ABS IMMATURE GRANULOCYTES: 0.11 10*3/uL — AB (ref 0.00–0.07)
BASOS ABS: 0 10*3/uL (ref 0.0–0.1)
Basophils Relative: 0 %
EOS ABS: 0 10*3/uL (ref 0.0–0.5)
Eosinophils Relative: 0 %
HEMATOCRIT: 56.5 % — AB (ref 39.0–52.0)
Hemoglobin: 19.3 g/dL — ABNORMAL HIGH (ref 13.0–17.0)
IMMATURE GRANULOCYTES: 1 %
LYMPHS ABS: 1.3 10*3/uL (ref 0.7–4.0)
Lymphocytes Relative: 9 %
MCH: 31.2 pg (ref 26.0–34.0)
MCHC: 34.2 g/dL (ref 30.0–36.0)
MCV: 91.4 fL (ref 80.0–100.0)
Monocytes Absolute: 1.9 10*3/uL — ABNORMAL HIGH (ref 0.1–1.0)
Monocytes Relative: 14 %
NRBC: 0 % (ref 0.0–0.2)
Neutro Abs: 10.6 10*3/uL — ABNORMAL HIGH (ref 1.7–7.7)
Neutrophils Relative %: 76 %
PLATELETS: 352 10*3/uL (ref 150–400)
RBC: 6.18 MIL/uL — ABNORMAL HIGH (ref 4.22–5.81)
RDW: 13.3 % (ref 11.5–15.5)
WBC: 14 10*3/uL — ABNORMAL HIGH (ref 4.0–10.5)

## 2018-07-12 LAB — URINALYSIS, ROUTINE W REFLEX MICROSCOPIC
BILIRUBIN URINE: NEGATIVE
Bacteria, UA: NONE SEEN
Glucose, UA: >=500 mg/dL — AB
HGB URINE DIPSTICK: NEGATIVE
Ketones, ur: 80 mg/dL — AB
LEUKOCYTES UA: NEGATIVE
Nitrite: NEGATIVE
PH: 5 (ref 5.0–8.0)
Protein, ur: NEGATIVE mg/dL
Specific Gravity, Urine: 1.027 (ref 1.005–1.030)

## 2018-07-12 LAB — BASIC METABOLIC PANEL
ANION GAP: 16 — AB (ref 5–15)
ANION GAP: 12 (ref 5–15)
BUN: 61 mg/dL — ABNORMAL HIGH (ref 6–20)
BUN: 49 mg/dL — ABNORMAL HIGH (ref 6–20)
CHLORIDE: 121 mmol/L — AB (ref 98–111)
CO2: 17 mmol/L — AB (ref 22–32)
CO2: 20 mmol/L — AB (ref 22–32)
Calcium: 9.2 mg/dL (ref 8.9–10.3)
Calcium: 8.9 mg/dL (ref 8.9–10.3)
Chloride: 124 mmol/L — ABNORMAL HIGH (ref 98–111)
Creatinine, Ser: 1.05 mg/dL (ref 0.61–1.24)
Creatinine, Ser: 0.83 mg/dL (ref 0.61–1.24)
GFR calc Af Amer: >60 mL/min (ref >60)
GFR calc Af Amer: >60 mL/min (ref >60)
GLUCOSE: 386 mg/dL — AB (ref 70–99)
GLUCOSE: 259 mg/dL — AB (ref 70–99)
POTASSIUM: 3.8 mmol/L (ref 3.5–5.1)
POTASSIUM: 3.5 mmol/L (ref 3.5–5.1)
Sodium: 154 mmol/L — ABNORMAL HIGH (ref 135–145)
Sodium: 156 mmol/L — ABNORMAL HIGH (ref 135–145)

## 2018-07-12 LAB — I-STAT CHEM 8, ED
BUN: 66 mg/dL — AB (ref 6–20)
CALCIUM ION: 1.27 mmol/L (ref 1.15–1.40)
CREATININE: 0.9 mg/dL (ref 0.61–1.24)
Chloride: 118 mmol/L — ABNORMAL HIGH (ref 98–111)
GLUCOSE: 473 mg/dL — AB (ref 70–99)
HEMATOCRIT: 45 % (ref 39.0–52.0)
HEMOGLOBIN: 15.3 g/dL (ref 13.0–17.0)
Potassium: 4.2 mmol/L (ref 3.5–5.1)
Sodium: 151 mmol/L — ABNORMAL HIGH (ref 135–145)
TCO2: 18 mmol/L — ABNORMAL LOW (ref 22–32)

## 2018-07-12 LAB — I-STAT CG4 LACTIC ACID, ED
Lactic Acid, Venous: 2.17 mmol/L (ref 0.5–1.9)
Lactic Acid, Venous: 2.48 mmol/L (ref 0.5–1.9)

## 2018-07-12 LAB — BLOOD GAS, VENOUS
Acid-base deficit: 8 mmol/L — ABNORMAL HIGH (ref 0.0–2.0)
Bicarbonate: 16.4 mmol/L — ABNORMAL LOW (ref 20.0–28.0)
FIO2: 21
O2 SAT: 53.7 %
PCO2 VEN: 32.3 mmHg — AB (ref 44.0–60.0)
PH VEN: 7.327 (ref 7.250–7.430)
PO2 VEN: 31.5 mmHg — AB (ref 32.0–45.0)
Patient temperature: 98.6

## 2018-07-12 LAB — PROTIME-INR
INR: 1.03
Prothrombin Time: 13.4 seconds (ref 11.4–15.2)

## 2018-07-12 MED ORDER — HYDROMORPHONE HCL 1 MG/ML IJ SOLN
1.0000 mg | Freq: Once | INTRAMUSCULAR | Status: AC
  Administered 2018-07-12: 1 mg via INTRAVENOUS
  Filled 2018-07-12: qty 1

## 2018-07-12 MED ORDER — ENOXAPARIN SODIUM 40 MG/0.4ML ~~LOC~~ SOLN
40.0000 mg | SUBCUTANEOUS | Status: DC
  Administered 2018-07-12 – 2018-07-14 (×3): 40 mg via SUBCUTANEOUS
  Filled 2018-07-12 (×3): qty 0.4

## 2018-07-12 MED ORDER — POTASSIUM CHLORIDE 10 MEQ/100ML IV SOLN
10.0000 meq | INTRAVENOUS | Status: DC
  Administered 2018-07-12: 10 meq via INTRAVENOUS
  Filled 2018-07-12: qty 100

## 2018-07-12 MED ORDER — ATORVASTATIN CALCIUM 10 MG PO TABS
20.0000 mg | ORAL_TABLET | Freq: Every day | ORAL | Status: DC
  Administered 2018-07-12 – 2018-07-14 (×3): 20 mg via ORAL
  Filled 2018-07-12: qty 2
  Filled 2018-07-12: qty 1
  Filled 2018-07-12: qty 2

## 2018-07-12 MED ORDER — LACTATED RINGERS IV BOLUS
1000.0000 mL | Freq: Once | INTRAVENOUS | Status: AC
  Administered 2018-07-12: 1000 mL via INTRAVENOUS

## 2018-07-12 MED ORDER — INSULIN REGULAR(HUMAN) IN NACL 100-0.9 UT/100ML-% IV SOLN: INTRAVENOUS | Status: DC

## 2018-07-12 MED ORDER — SODIUM CHLORIDE 0.9 % IV SOLN
INTRAVENOUS | Status: DC
  Administered 2018-07-12: 14:00:00 via INTRAVENOUS

## 2018-07-12 MED ORDER — INSULIN REGULAR(HUMAN) IN NACL 100-0.9 UT/100ML-% IV SOLN
INTRAVENOUS | Status: DC
  Administered 2018-07-12: 4.1 [IU]/h via INTRAVENOUS
  Administered 2018-07-13: 5 [IU]/h via INTRAVENOUS
  Filled 2018-07-12 (×2): qty 100

## 2018-07-12 MED ORDER — POTASSIUM CHLORIDE 10 MEQ/100ML IV SOLN
10.0000 meq | INTRAVENOUS | Status: DC
  Filled 2018-07-12 (×2): qty 100

## 2018-07-12 MED ORDER — SODIUM CHLORIDE 0.9 % IV SOLN
INTRAVENOUS | Status: AC
  Administered 2018-07-12: 15:00:00 via INTRAVENOUS

## 2018-07-12 MED ORDER — POTASSIUM CHLORIDE 10 MEQ/100ML IV SOLN
10.0000 meq | INTRAVENOUS | Status: DC | PRN
  Administered 2018-07-13 (×2): 10 meq via INTRAVENOUS
  Filled 2018-07-12 (×4): qty 100

## 2018-07-12 MED ORDER — ONDANSETRON HCL 4 MG/2ML IJ SOLN: 4.0000 mg | Freq: Four times a day (QID) | INTRAMUSCULAR | Status: DC | PRN

## 2018-07-12 MED ORDER — DEXTROSE-NACL 5-0.45 % IV SOLN
INTRAVENOUS | Status: DC
  Administered 2018-07-12 – 2018-07-13 (×2): via INTRAVENOUS

## 2018-07-12 MED ORDER — ONDANSETRON HCL 4 MG/2ML IJ SOLN
4.0000 mg | Freq: Once | INTRAMUSCULAR | Status: AC
  Administered 2018-07-12: 4 mg via INTRAVENOUS
  Filled 2018-07-12: qty 2

## 2018-07-12 MED ORDER — MORPHINE SULFATE (PF) 2 MG/ML IV SOLN
2.0000 mg | INTRAVENOUS | Status: DC | PRN
  Administered 2018-07-13: 2 mg via INTRAVENOUS
  Filled 2018-07-12: qty 1

## 2018-07-12 NOTE — ED Notes (Signed)
DR.  Ellender Hose notified of patient's lactic result.

## 2018-07-12 NOTE — ED Notes (Signed)
ED TO INPATIENT HANDOFF REPORT  Name/Age/Gender Dustin Goodman 52 y.o. male  Code Status    Code Status Orders  (From admission, onward)         Start     Ordered   07/12/18 1412  Full code  Continuous     07/12/18 1411        Code Status History    This patient has a current code status but no historical code status.      Home/SNF/Other Home  Chief Complaint weakness  Level of Care/Admitting Diagnosis ED Disposition    ED Disposition Condition [facility] Area: [place] [[id number]]  Level of Care: Stepdown [14]  Admit to SDU based on following criteria: Hemodynamic compromise or significant risk of instability:  Patient requiring short term acute titration and management of vasoactive drips, and invasive monitoring (i.e., CVP and Arterial line).  Diagnosis: DKA (diabetic ketoacidoses) [facility]) [id number]  Admitting Physician: Shelly Coss [[id number]]  Attending Physician: Shelly Coss [[id number]]  Estimated length of stay: past midnight tomorrow  Certification:: I certify this patient will need inpatient services for at least 2 midnights  PT Class (Do Not Modify): Inpatient [101]  PT Acc Code (Do Not Modify): Private [1]       Medical History Past Medical History:  Diagnosis Date  . Allergy    seasonal  . Diabetes mellitus 1998   type II  . Hyperlipidemia     Allergies Allergies  Allergen Reactions  . Dust Mite Extract Hives    Takes Xyzal  . Ibuprofen Hives    IV Location/Drains/Wounds Patient Lines/Drains/Airways Status   Active Line/Drains/Airways    Name:   Placement date:   Placement time:   Site:   Days:   Peripheral IV 07/12/18 Left Hand   07/12/18    1044    Hand   less than 1   Peripheral IV 07/12/18 Right Antecubital   07/12/18    1116    Antecubital   less than 1          Labs/Imaging Results for orders placed or performed during the hospital encounter of 07/12/18 (from the past 48 hour(s))   Comprehensive metabolic panel     Status: Abnormal   Collection Time: 07/12/18 10:37 AM  Result Value Ref Range   Sodium 149 (H) 135 - 145 mmol/L    Comment: REPEATED TO VERIFY   Potassium 4.0 3.5 - 5.1 mmol/L   Chloride 112 (H) 98 - 111 mmol/L    Comment: REPEATED TO VERIFY   CO2 14 (L) 22 - 32 mmol/L    Comment: REPEATED TO VERIFY   Glucose, Bld 602 (HH) 70 - 99 mg/dL    Comment: CRITICAL RESULT CALLED TO, READ BACK BY AND VERIFIED WITH: DOWD,P. RN AT 1117 07/12/18 MULLINS,T    BUN 82 (H) 6 - 20 mg/dL   Creatinine, Ser 1.54 (H) 0.61 - 1.24 mg/dL   Calcium 10.3 8.9 - 10.3 mg/dL   Total Protein 7.5 6.5 - 8.1 g/dL   Albumin 4.2 3.5 - 5.0 g/dL   AST 13 (L) 15 - 41 U/L   ALT 21 0 - 44 U/L   Alkaline Phosphatase 80 38 - 126 U/L   Total Bilirubin 2.8 (H) 0.3 - 1.2 mg/dL   GFR calc non Af Amer 51 (L) >60 mL/min   GFR calc Af Amer 59 (L) >60 mL/min    Comment: (NOTE) The eGFR has been calculated using the CKD  EPI equation. This calculation has not been validated in all clinical situations. eGFR's persistently <60 mL/min signify possible Chronic Kidney Disease.    Anion gap 23 (H) 5 - 15    Comment: REPEATED TO VERIFY Performed at [facility], [place] [address] [place], [place] [address]   Protime-INR     Status: None   Collection Time: 07/12/18 10:37 AM  Result Value Ref Range   Prothrombin Time 13.4 11.4 - 15.2 seconds   INR 1.03     Comment: Performed at [facility], [place] [address], [place], [place] [address]  Culture, blood (Routine x 2)     Status: None (Preliminary result)   Collection Time: 07/12/18 10:37 AM  Result Value Ref Range   Specimen Description BLOOD RIGHT ANTECUBITAL    Special Requests      BOTTLES DRAWN AEROBIC AND ANAEROBIC Blood Culture results may not be optimal due to an excessive volume of blood received in culture bottles Performed at [place] [address], [place], [place] [address]    Culture  PENDING    Report Status PENDING   CBC with Differential/Platelet     Status: Abnormal   Collection Time: 07/12/18 10:37 AM  Result Value Ref Range   WBC 14.0 (H) 4.0 - 10.5 K/uL   RBC 6.18 (H) 4.22 - 5.81 MIL/uL   Hemoglobin 19.3 (H) 13.0 - 17.0 g/dL   HCT 56.5 (H) 39.0 - 52.0 %   MCV 91.4 80.0 - 100.0 fL   MCH 31.2 26.0 - 34.0 pg   MCHC 34.2 30.0 - 36.0 g/dL   RDW 13.3 11.5 - 15.5 %   Platelets 352 150 - 400 K/uL   nRBC 0.0 0.0 - 0.2 %   Neutrophils Relative % 76 %   Neutro Abs 10.6 (H) 1.7 - 7.7 K/uL   Lymphocytes Relative 9 %   Lymphs Abs 1.3 0.7 - 4.0 K/uL   Monocytes Relative 14 %   Monocytes Absolute 1.9 (H) 0.1 - 1.0 K/uL   Eosinophils Relative 0 %   Eosinophils Absolute 0.0 0.0 - 0.5 K/uL   Basophils Relative 0 %   Basophils Absolute 0.0 0.0 - 0.1 K/uL   Immature Granulocytes 1 %   Abs Immature Granulocytes 0.11 (H) 0.00 - 0.07 K/uL    Comment: Performed at [facility], [place] [address], [place], [place] [address]  Culture, blood (Routine x 2)     Status: None (Preliminary result)   Collection Time: 07/12/18 10:40 AM  Result Value Ref Range   Specimen Description      BLOOD LEFT HAND Performed at [facility] [address], [place], [place] [address]    Special Requests      BOTTLES DRAWN AEROBIC AND ANAEROBIC Blood Culture results may not be optimal due to an inadequate volume of blood received in culture bottles Performed at [facility] [address] [place],  [address]    Culture PENDING    Report Status PENDING   I-Stat CG4 Lactic Acid, ED     Status: Abnormal   Collection Time: 07/12/18 10:45 AM  Result Value Ref Range   Lactic Acid, Venous 2.17 (HH) 0.5 - 1.9 mmol/L   Comment NOTIFIED PHYSICIAN   Blood gas, venous     Status: Abnormal   Collection Time: 07/12/18 11:50 AM  Result Value Ref Range   FIO2 21.00    Delivery systems ROOM AIR    pH,  Ven 7.327 7.250 - 7.430   pCO2, Ven 32.3 (L) 44.0 -  60.0 mmHg   pO2, Ven 31.5 (LL) 32.0 - 45.0 mmHg    Comment: RBV Duffy Bruce, MD AT 1202 BY KENNAN DEPUE, RRT,RCP ON 07/12/2018   Bicarbonate 16.4 (L) 20.0 - 28.0 mmol/L   Acid-base deficit 8.0 (H) 0.0 - 2.0 mmol/L   O2 Saturation 53.7 %   Patient temperature 98.6    Collection site VEIN    Drawn by RN    Sample type VEIN     Comment: Performed at [facility], [place] [address], [place], [place] [address]  I-Stat Chem 8, ED     Status: Abnormal   Collection Time: 07/12/18 12:08 PM  Result Value Ref Range   Sodium 151 (H) 135 - 145 mmol/L   Potassium 4.2 3.5 - 5.1 mmol/L   Chloride 118 (H) 98 - 111 mmol/L   BUN 66 (H) 6 - 20 mg/dL   Creatinine, Ser 0.90 0.61 - 1.24 mg/dL   Glucose, Bld 473 (H) 70 - 99 mg/dL   Calcium, Ion 1.27 1.15 - 1.40 mmol/L   TCO2 18 (L) 22 - 32 mmol/L   Hemoglobin 15.3 13.0 - 17.0 g/dL   HCT 45.0 39.0 - 52.0 %  I-Stat CG4 Lactic Acid, ED     Status: Abnormal   Collection Time: 07/12/18 12:09 PM  Result Value Ref Range   Lactic Acid, Venous 2.48 (HH) 0.5 - 1.9 mmol/L   Comment NOTIFIED PHYSICIAN   Urinalysis, Routine w reflex microscopic     Status: Abnormal   Collection Time: 07/12/18 12:28 PM  Result Value Ref Range   Color, Urine YELLOW YELLOW   APPearance CLEAR CLEAR   Specific Gravity, Urine 1.027 1.005 - 1.030   pH 5.0 5.0 - 8.0   Glucose, UA >=500 (A) NEGATIVE mg/dL   Hgb urine dipstick NEGATIVE NEGATIVE   Bilirubin Urine NEGATIVE NEGATIVE   Ketones, ur 80 (A) NEGATIVE mg/dL   Protein, ur NEGATIVE NEGATIVE mg/dL   Nitrite NEGATIVE NEGATIVE   Leukocytes, UA NEGATIVE NEGATIVE   RBC / HPF 0-5 0 - 5 RBC/hpf   WBC, UA 0-5 0 - 5 WBC/hpf   Bacteria, UA NONE SEEN NONE SEEN   Squamous Epithelial / LPF 0-5 0 - 5   Mucus PRESENT    Hyaline Casts, UA PRESENT     Comment: Performed at [facility], [place] [address], [place], [place] [address]  CBG monitoring, ED     Status: Abnormal   Collection Time: 07/12/18  1:45  PM  Result Value Ref Range   Glucose-Capillary 349 (H) 70 - 99 mg/dL  Basic metabolic panel     Status: Abnormal   Collection Time: 07/12/18  1:55 PM  Result Value Ref Range   Sodium 154 (H) 135 - 145 mmol/L   Potassium 3.8 3.5 - 5.1 mmol/L   Chloride 121 (H) 98 - 111 mmol/L   CO2 17 (L) 22 - 32 mmol/L   Glucose, Bld 386 (H) 70 - 99 mg/dL   BUN 61 (H) 6 - 20 mg/dL   Creatinine, Ser 1.05 0.61 - 1.24 mg/dL   Calcium 9.2 8.9 - 10.3 mg/dL   GFR calc non Af Amer >60 >60 mL/min   GFR calc Af Amer >60 >60 mL/min    Comment: (NOTE) The eGFR has been calculated using the CKD EPI equation. This calculation has not been validated in all clinical situations. eGFR's persistently <60 mL/min signify  possible Chronic Kidney Disease.    Anion gap 16 (H) 5 - 15    Comment: Performed at [facility], [place] [address], [place], [place] [address]  CBG monitoring, ED     Status: Abnormal   Collection Time: 07/12/18  3:01 PM  Result Value Ref Range   Glucose-Capillary 313 (H) 70 - 99 mg/dL  CBG monitoring, ED     Status: Abnormal   Collection Time: 07/12/18  4:06 PM  Result Value Ref Range   Glucose-Capillary 280 (H) 70 - 99 mg/dL  CBG monitoring, ED     Status: Abnormal   Collection Time: 07/12/18  5:11 PM  Result Value Ref Range   Glucose-Capillary 242 (H) 70 - 99 mg/dL  Basic metabolic panel     Status: Abnormal   Collection Time: 07/12/18  6:00 PM  Result Value Ref Range   Sodium 156 (H) 135 - 145 mmol/L   Potassium 3.5 3.5 - 5.1 mmol/L   Chloride 124 (H) 98 - 111 mmol/L   CO2 20 (L) 22 - 32 mmol/L   Glucose, Bld 259 (H) 70 - 99 mg/dL   BUN 49 (H) 6 - 20 mg/dL   Creatinine, Ser 0.83 0.61 - 1.24 mg/dL   Calcium 8.9 8.9 - 10.3 mg/dL   GFR calc non Af Amer >60 >60 mL/min   GFR calc Af Amer >60 >60 mL/min    Comment: (NOTE) The eGFR has been calculated using the CKD EPI equation. This calculation has not been validated in all clinical situations. eGFR's persistently <60  mL/min signify possible Chronic Kidney Disease.    Anion gap 12 5 - 15    Comment: Performed at [facility], [place] [address], [place], [place] [address]  CBG monitoring, ED     Status: Abnormal   Collection Time: 07/12/18  6:45 PM  Result Value Ref Range   Glucose-Capillary 217 (H) 70 - 99 mg/dL  CBG monitoring, ED     Status: Abnormal   Collection Time: 07/12/18  8:08 PM  Result Value Ref Range   Glucose-Capillary 188 (H) 70 - 99 mg/dL  CBG monitoring, ED     Status: Abnormal   Collection Time: 07/12/18  8:35 PM  Result Value Ref Range   Glucose-Capillary 201 (H) 70 - 99 mg/dL  CBG monitoring, ED     Status: Abnormal   Collection Time: 07/12/18  9:44 PM  Result Value Ref Range   Glucose-Capillary 176 (H) 70 - 99 mg/dL   Dg Chest 2 View  Result Date: 07/12/2018 CLINICAL DATA:  Weakness, lethargy over the last few days, history of carcinoma throat EXAM: CHEST - 2 VIEW COMPARISON:  Chest x-ray of 03/16/2016 FINDINGS: No active infiltrate or effusion is seen. Mediastinal and hilar contours unremarkable. The heart is within upper limits of normal. There are degenerative changes in the mid to lower thoracic spine. IMPRESSION: No active cardiopulmonary disease. Electronically Signed   By: Ivar Drape M.D.   On: 07/12/2018 12:06    Pending Labs Unresulted Labs (From admission, onward)    Start     Ordered   07/19/18 0500  Creatinine, serum  (enoxaparin (LOVENOX)    CrCl >/= 30 ml/min)  Weekly,   R    Comments:  while on enoxaparin therapy    07/12/18 1411   07/13/18 0500  Hemoglobin A1c  Tomorrow morning,   R     07/12/18 1412   07/13/18 0500  Lactic acid, plasma  Tomorrow morning,  R     07/12/18 1412   07/12/18 4854  Basic metabolic panel  STAT Now then every 4 hours ,   STAT     07/12/18 1355   07/12/18 1022  CBC with Differential  ONCE - STAT,   STAT     07/12/18 1022          Vitals/Pain Today's Vitals   07/12/18 2057 07/12/18 2100 07/12/18 2130  07/12/18 2200  BP: (!) 148/81 127/77 133/79 134/81  Pulse: (!) 102 (!) 108 (!) 111 100  Resp: 19 (!) '21 19 20  '$ Temp:      TempSrc:      SpO2: 94% 94% 95% 95%  Weight:      Height:        Isolation Precautions No active isolations  Medications Medications  insulin regular, human (MYXREDLIN) 100 units/ 100 mL infusion (5.8 Units/hr Intravenous Rate/Dose Change 07/12/18 2158)  0.9 %  sodium chloride infusion ( Intravenous Stopped 07/12/18 1615)  0.9 %  sodium chloride infusion ( Intravenous Stopped 07/12/18 1750)  dextrose 5 %-0.45 % sodium chloride infusion ( Intravenous New Bag/Given 07/12/18 1751)  atorvastatin (LIPITOR) tablet 20 mg (20 mg Oral Given 07/12/18 2013)  enoxaparin (LOVENOX) injection 40 mg (40 mg Subcutaneous Given 07/12/18 2016)  morphine 2 MG/ML injection 2 mg (has no administration in time range)  ondansetron (ZOFRAN) injection 4 mg (has no administration in time range)  potassium chloride 10 mEq in 100 mL IVPB (has no administration in time range)  lactated ringers bolus 1,000 mL (1,000 mLs Intravenous Bolus 07/12/18 1113)  lactated ringers bolus 1,000 mL (1,000 mLs Intravenous Bolus 07/12/18 1056)  lactated ringers bolus 1,000 mL (1,000 mLs Intravenous Bolus 07/12/18 1056)  HYDROmorphone (DILAUDID) injection 1 mg (1 mg Intravenous Given 07/12/18 1058)  ondansetron (ZOFRAN) injection 4 mg (4 mg Intravenous Given 07/12/18 1056)    Mobility manual wheelchair (c/o weakness)

## 2018-07-12 NOTE — ED Notes (Signed)
Patient reminded of pending urine sample. Patient will call out when ready.

## 2018-07-12 NOTE — ED Notes (Signed)
Insulin drip currently out in pyxis. Pharmacy text paged to send dose urgently via tube system.

## 2018-07-12 NOTE — ED Notes (Signed)
Ed provider [place] notified patient has a critical lactic acid value of 2.48

## 2018-07-12 NOTE — ED Notes (Signed)
Pt's sugar has been within range twice now. Admitting MD notified

## 2018-07-12 NOTE — ED Triage Notes (Signed)
Patient BIB Ems from home. Patient has history of throat cancer, and had surgery on throat last week at [facility]. Patient was d/c'd on Thursday from hospital. Patient's wife reports patient has had increased lethargy and weakness since Saturday. Patient has history of Diabetes. Patient reports not taking his metformin due to weakness for the past couple of days. CBG for EMS= 291. EMS HR 110, EMS BP 136/98.

## 2018-07-12 NOTE — ED Provider Notes (Addendum)
[facility] DEPT Provider Note   CSN: [id number] Arrival date & time: 07/12/18  1009     History   Chief Complaint Chief Complaint  Patient presents with  . Weakness    HPI Dustin Goodman is a 52 y.o. male.  HPI 52 year old male with history of tonsillar cancer status post recent resection here with nausea, vomiting, general weakness.  Patient states that since his recent discharge from [facility], he has had extreme difficulty eating and drinking.  He said pain due to his surgery.  He has had associated progressively worsening nausea, vomiting, and now shortness of breath.  He feels generally very weak.  He has a history of diabetes but has not been able to tolerate his metformin due to his nausea as well as poor p.o. intake.  Denies any fevers.  No abdominal pain.  Surgical site is healing well and he denies any increased pain or redness or drainage from the surgical site.  No other complaints.  Past Medical History:  Diagnosis Date  . Allergy    seasonal  . Diabetes mellitus 1998   type II  . Hyperlipidemia     Patient Active Problem List   Diagnosis Date Noted  . Hypernatremia 07/12/2018  . Dehydration 07/12/2018  . DKA (diabetic ketoacidoses) ([place]) 07/12/2018  . Lactic acid acidosis 07/12/2018  . 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 Cancer of tonsillar fossa ([place]) 06/08/2018  . Anxiety 02/03/2018  . Insomnia 02/03/2018  . PCP NOTES >>>>>>>>>>>>>>>>>. 03/17/2016  . Erectile dysfunction 10/25/2014  . Irritability and anger 03/09/2012  . General medical examination 11/26/2011  . Back pain 06/04/2010  . High cholesterol 09/13/2009  . ALLERGIC RHINITIS DUE TO POLLEN 01/14/2009  . ALLERGIC URTICARIA 01/14/2009  . ACHILLES TENDINITIS 07/19/2007  . DMII (diabetes mellitus, type 2) ([place]) 01/13/2007    Past Surgical History:  Procedure Laterality Date  . CHOLECYSTECTOMY  1998  . SHOULDER ARTHROSCOPY Right  2010    Dr Onnie Graham         Home Medications    Prior to Admission medications   Medication Sig Start Date End Date Taking? Authorizing Provider  atorvastatin (LIPITOR) 20 MG tablet Take 1 tablet (20 mg total) by mouth daily. 06/01/18  Yes Paz, Alda Berthold, MD  Dapagliflozin-metFORMIN HCl ER (XIGDUO XR) 12-998 MG TB24 Take 2 tablets by mouth daily with breakfast.   Yes [provider]  levocetirizine (XYZAL) 5 MG tablet Take 1 tablet (5 mg total) by mouth daily. 10/21/17  Yes Paz, Alda Berthold, MD  Multiple Vitamins-Minerals (CENTRUM PO) Take 1 tablet by mouth daily.     Yes [provider]  oxyCODONE (OXY IR/ROXICODONE) 5 MG immediate release tablet Take 2.5 mg by mouth every 6 (six) hours as needed for pain. 07/06/18  Yes [provider]  sodium fluoride (PREVIDENT 5000 PLUS) 1.1 % CREA dental cream Apply to tooth brush. Brush teeth for 2 minutes. Spit out excess. DO NOT rinse afterwards. Repeat nightly. 06/28/18  Yes Lenn Cal, DDS  glucose blood (ONE TOUCH ULTRA TEST) test strip Reported on 03/16/2016    [provider]    Family History Family History  Problem Relation Age of Onset  . Cancer Maternal Aunt        breast  . Diabetes Maternal Uncle   . Diabetes Maternal Grandmother   . Breast cancer Mother   . Colon cancer Neg Hx   . Prostate cancer Neg Hx   .  CAD Neg Hx     Social History Social History   Tobacco Use  . Smoking status: Never Smoker  . Smokeless tobacco: Never Used  Substance Use Topics  . Alcohol use: Yes    Comment: rarely  . Drug use: No     Allergies   Dust mite extract and Ibuprofen   Review of Systems Review of Systems  Constitutional: Positive for fatigue. Negative for chills and fever.  HENT: Negative for congestion and rhinorrhea.   Eyes: Negative for visual disturbance.  Respiratory: Negative for cough, shortness of breath and wheezing.   Cardiovascular: Negative for chest pain and leg swelling.    Gastrointestinal: Negative for abdominal pain, diarrhea, nausea and vomiting.  Endocrine: Positive for polydipsia and polyuria.  Genitourinary: Negative for dysuria and flank pain.  Musculoskeletal: Negative for neck pain and neck stiffness.  Skin: Negative for rash and wound.  Allergic/Immunologic: Negative for immunocompromised state.  Neurological: Positive for weakness. Negative for syncope and headaches.  All other systems reviewed and are negative.    Physical Exam Updated Vital Signs BP 138/86   Pulse (!) 108   Temp 97.9 F (36.6 C) (Axillary) Comment (Src): unable to read oral  Resp (!) 21   Ht 5\' 9"  (1.753 m)   Wt 102.5 kg   SpO2 95%   BMI 33.37 kg/m   Physical Exam  Constitutional: He is oriented to person, place, and time. He appears well-developed and well-nourished. He appears distressed.  HENT:  Head: Normocephalic and atraumatic.  Markedly dry MM. S/p recent tonsillar tumor removal. OP patent. No bleeding.  Eyes: Conjunctivae are normal.  Neck: Neck supple.  Left-sided surgical site c/d/i, no surrounding erythema or drainage, no fluctuance  Cardiovascular: Regular rhythm and normal heart sounds. Tachycardia present. Exam reveals no friction rub.  No murmur heard. Pulmonary/Chest: Effort normal and breath sounds normal. No respiratory distress. He has no wheezes. He has no rales.  Abdominal: Soft. Bowel sounds are normal. He exhibits no distension. There is no tenderness.  Musculoskeletal: He exhibits no edema.  Neurological: He is alert and oriented to person, place, and time. He exhibits normal muscle tone.  Skin: Skin is warm. Capillary refill takes less than 2 seconds.  Psychiatric: He has a normal mood and affect.  Nursing note and vitals reviewed.    ED Treatments / Results  Labs (all labs ordered are listed, but only abnormal results are displayed) Labs Reviewed  COMPREHENSIVE METABOLIC PANEL - Abnormal; Notable for the following components:       Result Value   Sodium 149 (*)    Chloride 112 (*)    CO2 14 (*)    Glucose, Bld 602 (*)    BUN 82 (*)    Creatinine, Ser 1.54 (*)    AST 13 (*)    Total Bilirubin 2.8 (*)    GFR calc non Af Amer 51 (*)    GFR calc Af Amer 59 (*)    Anion gap 23 (*)    All other components within normal limits  URINALYSIS, ROUTINE W REFLEX MICROSCOPIC - Abnormal; Notable for the following components:   Glucose, UA >=500 (*)    Ketones, ur 80 (*)    All other components within normal limits  BLOOD GAS, VENOUS - Abnormal; Notable for the following components:   pCO2, Ven 32.3 (*)    pO2, Ven 31.5 (*)    Bicarbonate 16.4 (*)    Acid-base deficit 8.0 (*)    All other components  within normal limits  CBC WITH DIFFERENTIAL/PLATELET - Abnormal; Notable for the following components:   WBC 14.0 (*)    RBC 6.18 (*)    Hemoglobin 19.3 (*)    HCT 56.5 (*)    Neutro Abs 10.6 (*)    Monocytes Absolute 1.9 (*)    Abs Immature Granulocytes 0.11 (*)    All other components within normal limits  BASIC METABOLIC PANEL - Abnormal; Notable for the following components:   Sodium 154 (*)    Chloride 121 (*)    CO2 17 (*)    Glucose, Bld 386 (*)    BUN 61 (*)    Anion gap 16 (*)    All other components within normal limits  I-STAT CG4 LACTIC ACID, ED - Abnormal; Notable for the following components:   Lactic Acid, Venous 2.17 (*)    All other components within normal limits  I-STAT CHEM 8, ED - Abnormal; Notable for the following components:   Sodium 151 (*)    Chloride 118 (*)    BUN 66 (*)    Glucose, Bld 473 (*)    TCO2 18 (*)    All other components within normal limits  I-STAT CG4 LACTIC ACID, ED - Abnormal; Notable for the following components:   Lactic Acid, Venous 2.48 (*)    All other components within normal limits  CBG MONITORING, ED - Abnormal; Notable for the following components:   Glucose-Capillary 349 (*)    All other components within normal limits  CBG MONITORING, ED - Abnormal;  Notable for the following components:   Glucose-Capillary 313 (*)    All other components within normal limits  CULTURE, BLOOD (ROUTINE X 2)  CULTURE, BLOOD (ROUTINE X 2)  PROTIME-INR  CBC WITH DIFFERENTIAL/PLATELET  BASIC METABOLIC PANEL  BASIC METABOLIC PANEL  BASIC METABOLIC PANEL    EKG EKG Interpretation  Date/Time:  Tuesday July 12 2018 10:27:04 EST Ventricular Rate:  123 PR Interval:    QRS Duration: 77 QT Interval:  347 QTC Calculation: 497 R Axis:   -71 Text Interpretation:  Sinus tachycardia Abnormal R-wave progression, early transition LVH with secondary repolarization abnormality Inferior infarct, old Anterior Q waves, possibly due to LVH No old tracing to compare Confirmed by Duffy Bruce [phone]) on 07/12/2018 3:51:22 PM   Radiology Dg Chest 2 View  Result Date: 07/12/2018 CLINICAL DATA:  Weakness, lethargy over the last few days, history of carcinoma throat EXAM: CHEST - 2 VIEW COMPARISON:  Chest x-ray of 03/16/2016 FINDINGS: No active infiltrate or effusion is seen. Mediastinal and hilar contours unremarkable. The heart is within upper limits of normal. There are degenerative changes in the mid to lower thoracic spine. IMPRESSION: No active cardiopulmonary disease. Electronically Signed   By: Ivar Drape M.D.   On: 07/12/2018 12:06    Procedures .Critical Care Performed by: Duffy Bruce, MD Authorized by: Duffy Bruce, MD   Critical care provider statement:    Critical care time (minutes):  35   Critical care time was exclusive of:  Separately billable procedures and treating other patients and teaching time   Critical care was necessary to treat or prevent imminent or life-threatening deterioration of the following conditions:  Cardiac failure, circulatory failure and metabolic crisis   Critical care was time spent personally by me on the following activities:  Development of treatment plan with patient or surrogate, discussions with consultants,  evaluation of patient's response to treatment, examination of patient, obtaining history from patient or surrogate, ordering and  performing treatments and interventions, ordering and review of laboratory studies, ordering and review of radiographic studies, pulse oximetry, re-evaluation of patient's condition and review of old charts   I assumed direction of critical care for this patient from another provider in my specialty: no     (including critical care time)  Medications Ordered in ED Medications  insulin regular, human (MYXREDLIN) 100 units/ 100 mL infusion (2.9 Units/hr Intravenous Rate/Dose Change 07/12/18 1411)  0.9 %  sodium chloride infusion ( Intravenous New Bag/Given 07/12/18 1438)  0.9 %  sodium chloride infusion ( Intravenous New Bag/Given 07/12/18 1421)  dextrose 5 %-0.45 % sodium chloride infusion (has no administration in time range)  atorvastatin (LIPITOR) tablet 20 mg (has no administration in time range)  enoxaparin (LOVENOX) injection 40 mg (has no administration in time range)  morphine 2 MG/ML injection 2 mg (has no administration in time range)  ondansetron (ZOFRAN) injection 4 mg (has no administration in time range)  potassium chloride 10 mEq in 100 mL IVPB (has no administration in time range)  lactated ringers bolus 1,000 mL (1,000 mLs Intravenous Bolus 07/12/18 1113)  lactated ringers bolus 1,000 mL (1,000 mLs Intravenous Bolus 07/12/18 1056)  lactated ringers bolus 1,000 mL (1,000 mLs Intravenous Bolus 07/12/18 1056)  HYDROmorphone (DILAUDID) injection 1 mg (1 mg Intravenous Given 07/12/18 1058)  ondansetron (ZOFRAN) injection 4 mg (4 mg Intravenous Given 07/12/18 1056)     Initial Impression / Assessment and Plan / ED Course  I have reviewed the triage vital signs and the nursing notes.  Pertinent labs & imaging results that were available during my care of the patient were reviewed by me and considered in my medical decision making (see chart for  details).     52 year old male here with nausea, generalized weakness after recent tonsil surgery.  Lab work shows dehydration and DKA likely secondary to steroid use during his surgery as well as poor p.o. intake and inability to tolerate his diabetic medications.  Patient placed on IV insulin drip.  He feels markedly improved with fluid resuscitation.  Will admit. No fever, no apparent infectious complications of surgery. He is mentating well.  Final Clinical Impressions(s) / ED Diagnoses   Final diagnoses:  Type 2 diabetes mellitus with ketoacidosis without coma, with long-term current use of insulin ([place])  AKI (acute kidney injury) [facility])  Dehydration    ED Discharge Orders    None       Duffy Bruce, MD 07/12/18 Marella Bile    Duffy Bruce, MD 07/12/18 1553

## 2018-07-12 NOTE — ED Notes (Signed)
Bed: UV25 Expected date:  Expected time:  Means of arrival:  Comments: EMS- 52yo M, CA Pt, fatigue/post op

## 2018-07-12 NOTE — H&P (Signed)
History and Physical    DYLON CORREA ION:[id number] DOB: 1965-12-29 DOA: 07/12/2018  PCP: Colon Branch, MD   Patient coming from: Home  Chief Complaint: Generalized weakness  HPI: Dustin Goodman is a 52 y.o. 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  He recently had surgery at [facility] for his tonsilar cancer by his ENT surgeon and was discharged on last Wednesday.  He also reported that he was on Decadron during his hospitalization.  Since his discharge, he had extreme difficulty eating and drinking mainly because of pain due to surgery.  He has loss of appetite.  Nausea and vomiting gradually worsened.  Today he reported severe weakness and had to come to the emergency department. Patient seen and examined the bedside.  He was found to be in DKA.  Noted to be hypertensive and tachycardic and severely dehydrated on presentation.  He was dry as bone. Patient does not complain of any chest pain, shortness of breath, palpitations, abdominal pain, diarrhea, fever, chills, headache.  ED Course: Started on IV insulin and IV fluids   Past Medical History:  Diagnosis Date  . Allergy    seasonal  . Diabetes mellitus 1998   type II  . Hyperlipidemia     Past Surgical History:  Procedure Laterality Date  . CHOLECYSTECTOMY  1998  . SHOULDER ARTHROSCOPY Right 2010    Dr Onnie Graham      reports that he has never smoked. He has never used smokeless tobacco. He reports that he drinks alcohol. He reports that he does not use drugs.  Allergies  Allergen Reactions  . Dust Mite Extract Hives    Takes Xyzal  . Ibuprofen Hives    Family History  Problem Relation Age of Onset  . Cancer Maternal Aunt        breast   . Diabetes Maternal Uncle   . Diabetes Maternal Grandmother   . Breast cancer Mother   . Colon cancer Neg Hx   . Prostate cancer Neg Hx   . CAD Neg Hx      Prior to Admission medications   Medication Sig Start Date End Date Taking? Authorizing Provider  atorvastatin (LIPITOR) 20 MG tablet Take 1 tablet (20 mg total) by mouth daily. 06/01/18  Yes Paz, Alda Berthold, MD  Dapagliflozin-metFORMIN HCl ER (XIGDUO XR) 12-998 MG TB24 Take 2 tablets by mouth daily with breakfast.   Yes [provider]  levocetirizine (XYZAL) 5 MG tablet Take 1 tablet (5 mg total) by mouth daily. 10/21/17  Yes Paz, Alda Berthold, MD  Multiple Vitamins-Minerals (CENTRUM PO) Take 1 tablet by mouth daily.     Yes [provider]  oxyCODONE (OXY IR/ROXICODONE) 5 MG immediate release tablet Take 2.5 mg by mouth every 6 (six) hours as needed for pain. 07/06/18  Yes [provider]  sodium fluoride (PREVIDENT 5000 PLUS) 1.1 % CREA dental cream Apply to tooth brush. Brush teeth for 2 minutes. Spit out excess. DO NOT rinse afterwards. Repeat nightly. 06/28/18  Yes Lenn Cal, DDS  glucose blood (ONE TOUCH ULTRA TEST) test strip Reported on 03/16/2016    [provider]    Physical Exam: Vitals:   07/12/18 1200 07/12/18 1239 07/12/18 1300 07/12/18 1330  BP: (!) 150/90 (!) 153/90 (!) 145/98 Marland Kitchen)  144/110  Pulse: (!) 109 (!) 111  (!) 118  Resp: 11 16 14 18   Temp:      TempSrc:      SpO2: 100% 99%  97%  Weight:      Height:        Constitutional: weak Vitals:   07/12/18 1200 07/12/18 1239 07/12/18 1300 07/12/18 1330  BP: (!) 150/90 (!) 153/90 (!) 145/98 (!) 144/110  Pulse: (!) 109 (!) 111  (!) 118  Resp: 11 16 14 18   Temp:      TempSrc:      SpO2: 100% 99%  97%  Weight:      Height:       Eyes: PERRL, lids and conjunctivae normal ENMT: Mucous membranes are dry. Posterior pharynx clear of any exudate or lesions.Normal dentition.  Healing surgical scar  neck: normal, supple, no  masses, no thyromegaly Respiratory: clear to auscultation bilaterally, no wheezing, no crackles. Normal respiratory effort. No accessory muscle use.  Cardiovascular: Regular rate and rhythm, no murmurs / rubs / gallops. No extremity edema. 2+ pedal pulses. No carotid bruits.  Abdomen: no tenderness, no masses palpated. No hepatosplenomegaly. Bowel sounds positive.  Musculoskeletal: no clubbing / cyanosis. No joint deformity upper and lower extremities. Good ROM, no contractures. Normal muscle tone.  Skin: no rashes, lesions, ulcers. No induration Neurologic: CN 2-12 grossly intact. Sensation intact, DTR normal. Strength 5/5 in all 4.  Psychiatric: Normal judgment and insight. Alert and oriented x 3. Normal mood.   Foley Catheter:None  Labs on Admission: I have personally reviewed following labs and imaging studies  CBC: Recent Labs  Lab 07/12/18 1037 07/12/18 1208  WBC 14.0*  --   NEUTROABS 10.6*  --   HGB 19.3* 15.3  HCT 56.5* 45.0  MCV 91.4  --   PLT 352  --    Basic Metabolic Panel: Recent Labs  Lab 07/12/18 1037 07/12/18 1208  NA 149* 151*  K 4.0 4.2  CL 112* 118*  CO2 14*  --   GLUCOSE 602* 473*  BUN 82* 66*  CREATININE 1.54* 0.90  CALCIUM 10.3  --    GFR: Estimated Creatinine Clearance: 115.8 mL/min (by C-G formula based on SCr of 0.9 mg/dL). Liver Function Tests: Recent Labs  Lab 07/12/18 1037  AST 13*  ALT 21  ALKPHOS 80  BILITOT 2.8*  PROT 7.5  ALBUMIN 4.2   No results for input(s): LIPASE, AMYLASE in the last 168 hours. No results for input(s): AMMONIA in the last 168 hours. Coagulation Profile: Recent Labs  Lab 07/12/18 1037  INR 1.03   Cardiac Enzymes: No results for input(s): CKTOTAL, CKMB, CKMBINDEX, TROPONINI in the last 168 hours. BNP (last 3 results) No results for input(s): PROBNP in the last 8760 hours. HbA1C: No results for input(s): HGBA1C in the last 72 hours. CBG: Recent Labs  Lab 07/12/18 1345  GLUCAP 349*   Lipid  Profile: No results for input(s): CHOL, HDL, LDLCALC, TRIG, CHOLHDL, LDLDIRECT in the last 72 hours. Thyroid Function Tests: No results for input(s): TSH, T4TOTAL, FREET4, T3FREE, THYROIDAB in the last 72 hours. Anemia Panel: No results for input(s): VITAMINB12, FOLATE, FERRITIN, TIBC, IRON, RETICCTPCT in the last 72 hours. Urine analysis:    Component Value Date/Time   COLORURINE YELLOW 07/12/2018 [place] 07/12/2018 1228   LABSPEC 1.027 07/12/2018 1228   PHURINE 5.0 07/12/2018 1228   GLUCOSEU >=500 (A) 07/12/2018 1228   HGBUR NEGATIVE 07/12/2018 [place] 07/12/2018 1228  KETONESUR 80 (A) 07/12/2018 1228   PROTEINUR NEGATIVE 07/12/2018 1228   NITRITE NEGATIVE 07/12/2018 1228   LEUKOCYTESUR NEGATIVE 07/12/2018 1228    Radiological Exams on Admission: Dg Chest 2 View  Result Date: 07/12/2018 CLINICAL DATA:  Weakness, lethargy over the last few days, history of carcinoma throat EXAM: CHEST - 2 VIEW COMPARISON:  Chest x-ray of 03/16/2016 FINDINGS: No active infiltrate or effusion is seen. Mediastinal and hilar contours unremarkable. The heart is within upper limits of normal. There are degenerative changes in the mid to lower thoracic spine. IMPRESSION: No active cardiopulmonary disease. Electronically Signed   By: Ivar Drape M.D.   On: 07/12/2018 12:06     Assessment/Plan Principal Problem:   DKA (diabetic ketoacidoses) ([place]) Active Problems:   DMII (diabetes mellitus, type 2) ([facility])   High cholesterol   Hypernatremia   Dehydration   Lactic acid acidosis  DKA: Most likely precipitated after his recent surgery due to stress, pain.  Was also reported to be on Decadron earlier.  Had very poor oral intake. We will continue DKA protocol.  Started on insulin drip.  We will monitor him on stepdown.  Continue IV fluids.  Monitor BMP every 4 hours. Presented with anion gap of 23 and blood sugars in the range of 600s.  Diabetes type 2: His last  hemoglobin C on 9/19 was 7.7.  He was on Xigduo at home.  He is compliant with his medications. We will keep him on sliding scale insulin after his gap closes.  We will check new hemoglobin A1c level.  Tonsillar cancer: On reviewing the available labs, surgical pathology report on 10/28 confirms poorly differentiated squamous cell carcinoma of the left tonsil.  Underwent resection of the tonsil just few days ago during his hospitalization at [facility] .He follows with his ENT physician at [facility] and he has an appointment on November 19.They are planning for further management.  Dehydration/lactic acidosis: Continue IV fluids.  Lactic acidosis is secondary to dehydration, volume depletion.  Hypernatremia: Secondary to severe dehydration.  Corrected actual sodium level will be  higher than the current value which will probably surface after his blood glucose goes down.  Hyperlipidemia: Continue Lipitor   Severity of Illness: The appropriate patient status for this patient is INPATIENT. I   DVT prophylaxis: Lovenox Code Status: Full Family Communication: None present at the bed side Consults called: None     Shelly Coss MD [facility] Pager [phone]  If 7PM-7AM, please contact night-coverage [email or web address] Password [email or web address]  07/12/2018, 2:05 PM

## 2018-07-12 NOTE — ED Notes (Signed)
Attempted to give report. Rn will call back

## 2018-07-13 DIAGNOSIS — E7800 Pure hypercholesterolemia, unspecified: Secondary | ICD-10-CM

## 2018-07-13 DIAGNOSIS — E1110 Type 2 diabetes mellitus with ketoacidosis without coma: Principal | ICD-10-CM

## 2018-07-13 DIAGNOSIS — E872 Acidosis: Secondary | ICD-10-CM

## 2018-07-13 DIAGNOSIS — E870 Hyperosmolality and hypernatremia: Secondary | ICD-10-CM

## 2018-07-13 DIAGNOSIS — E860 Dehydration: Secondary | ICD-10-CM

## 2018-07-13 LAB — BASIC METABOLIC PANEL
Anion gap: 6 (ref 5–15)
Anion gap: 6 (ref 5–15)
BUN: 45 mg/dL — ABNORMAL HIGH (ref 6–20)
BUN: 42 mg/dL — AB (ref 6–20)
CHLORIDE: 127 mmol/L — AB (ref 98–111)
CO2: 25 mmol/L (ref 22–32)
CO2: 26 mmol/L (ref 22–32)
Calcium: 9 mg/dL (ref 8.9–10.3)
Calcium: 9.1 mg/dL (ref 8.9–10.3)
Chloride: 127 mmol/L — ABNORMAL HIGH (ref 98–111)
Creatinine, Ser: 0.71 mg/dL (ref 0.61–1.24)
Creatinine, Ser: 0.64 mg/dL (ref 0.61–1.24)
GFR calc Af Amer: >60 mL/min (ref >60)
GFR calc non Af Amer: >60 mL/min (ref >60)
GLUCOSE: 169 mg/dL — AB (ref 70–99)
Glucose, Bld: 197 mg/dL — ABNORMAL HIGH (ref 70–99)
POTASSIUM: 3.4 mmol/L — AB (ref 3.5–5.1)
Potassium: 3.3 mmol/L — ABNORMAL LOW (ref 3.5–5.1)
SODIUM: 158 mmol/L — AB (ref 135–145)
Sodium: 159 mmol/L — ABNORMAL HIGH (ref 135–145)

## 2018-07-13 LAB — GLUCOSE, CAPILLARY
GLUCOSE-CAPILLARY: 153 mg/dL — AB (ref 70–99)
GLUCOSE-CAPILLARY: 159 mg/dL — AB (ref 70–99)
GLUCOSE-CAPILLARY: 171 mg/dL — AB (ref 70–99)
GLUCOSE-CAPILLARY: 205 mg/dL — AB (ref 70–99)
GLUCOSE-CAPILLARY: 246 mg/dL — AB (ref 70–99)
GLUCOSE-CAPILLARY: 251 mg/dL — AB (ref 70–99)
Glucose-Capillary: 145 mg/dL — ABNORMAL HIGH (ref 70–99)
Glucose-Capillary: 138 mg/dL — ABNORMAL HIGH (ref 70–99)
Glucose-Capillary: 163 mg/dL — ABNORMAL HIGH (ref 70–99)
Glucose-Capillary: 155 mg/dL — ABNORMAL HIGH (ref 70–99)
Glucose-Capillary: 253 mg/dL — ABNORMAL HIGH (ref 70–99)

## 2018-07-13 LAB — LACTIC ACID, PLASMA: Lactic Acid, Venous: 1.2 mmol/L (ref 0.5–1.9)

## 2018-07-13 LAB — HEMOGLOBIN A1C
HEMOGLOBIN A1C: 7.2 % — AB (ref 4.8–5.6)
Mean Plasma Glucose: 159.94 mg/dL

## 2018-07-13 LAB — MRSA PCR SCREENING: MRSA by PCR: NEGATIVE

## 2018-07-13 MED ORDER — INSULIN GLARGINE 100 UNIT/ML ~~LOC~~ SOLN
5.0000 [IU] | Freq: Every day | SUBCUTANEOUS | Status: DC
  Administered 2018-07-13: 5 [IU] via SUBCUTANEOUS
  Filled 2018-07-13: qty 0.05

## 2018-07-13 MED ORDER — DEXTROSE-NACL 5-0.2 % IV SOLN
INTRAVENOUS | Status: DC
  Administered 2018-07-13 – 2018-07-14 (×2): via INTRAVENOUS

## 2018-07-13 MED ORDER — GLUCERNA SHAKE PO LIQD
237.0000 mL | Freq: Three times a day (TID) | ORAL | Status: DC
  Administered 2018-07-13 – 2018-07-14 (×3): 237 mL via ORAL
  Filled 2018-07-13 (×5): qty 237

## 2018-07-13 MED ORDER — ACETAMINOPHEN 325 MG PO TABS: 650.0000 mg | ORAL_TABLET | Freq: Four times a day (QID) | ORAL | Status: DC | PRN

## 2018-07-13 MED ORDER — INSULIN ASPART 100 UNIT/ML ~~LOC~~ SOLN
0.0000 [IU] | Freq: Three times a day (TID) | SUBCUTANEOUS | Status: DC
  Administered 2018-07-13: 8 [IU] via SUBCUTANEOUS
  Administered 2018-07-13: 3 [IU] via SUBCUTANEOUS
  Administered 2018-07-13: 8 [IU] via SUBCUTANEOUS
  Administered 2018-07-14: 5 [IU] via SUBCUTANEOUS
  Administered 2018-07-14: 3 [IU] via SUBCUTANEOUS
  Administered 2018-07-14: 5 [IU] via SUBCUTANEOUS
  Administered 2018-07-15: 2 [IU] via SUBCUTANEOUS
  Administered 2018-07-15: 3 [IU] via SUBCUTANEOUS

## 2018-07-13 MED ORDER — INSULIN ASPART 100 UNIT/ML ~~LOC~~ SOLN
0.0000 [IU] | Freq: Every day | SUBCUTANEOUS | Status: DC
  Administered 2018-07-13: 2 [IU] via SUBCUTANEOUS

## 2018-07-13 MED ORDER — OXYCODONE HCL 5 MG PO TABS
2.5000 mg | ORAL_TABLET | Freq: Four times a day (QID) | ORAL | Status: DC | PRN
  Administered 2018-07-13: 2.5 mg via ORAL
  Filled 2018-07-13: qty 1

## 2018-07-13 MED ORDER — POTASSIUM CHLORIDE CRYS ER 20 MEQ PO TBCR
40.0000 meq | EXTENDED_RELEASE_TABLET | Freq: Once | ORAL | Status: AC
  Administered 2018-07-13: 40 meq via ORAL
  Filled 2018-07-13: qty 2

## 2018-07-13 MED ORDER — INSULIN GLARGINE 100 UNIT/ML ~~LOC~~ SOLN
5.0000 [IU] | Freq: Two times a day (BID) | SUBCUTANEOUS | Status: DC
  Administered 2018-07-13: 5 [IU] via SUBCUTANEOUS
  Filled 2018-07-13 (×2): qty 0.05

## 2018-07-13 NOTE — Progress Notes (Signed)
PROGRESS NOTE  Dustin Goodman RCV:[id number] DOB: [birth year] DOA: 07/12/2018 PCP: Colon Branch, MD  HPI/Recap of past 24 hours: HPI from Dr Dorris Singh is a 52 y.o. 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  He recently had surgery at [facility] for his tonsilar cancer by his ENT surgeon and was discharged on last Wednesday.  He also reported that he was on Decadron during his hospitalization.  Since his discharge, he had extreme difficulty eating and drinking mainly because of pain due to surgery.  He has loss of appetite.  Nausea and vomiting gradually worsened. In the ED, pt was found to be in DKA.  Noted to be hypertensive and tachycardic and severely dehydrated on presentation.  He was dry as bone. Pt was admitted for further management.   Today, pt reported feeling slightly better, still with difficulty eating due to recent surgery and odynophagia. Still with generalized weakness. Denies further nausea/vomiting, fever/chills, abdominal pain, chest pain.   Assessment/Plan: Principal Problem:   DKA (diabetic ketoacidoses) ([place]) Active Problems:   DMII (diabetes mellitus, type 2) (HCC)   High cholesterol   Hypernatremia   Dehydration   Lactic acid acidosis  DKA with a history of diabetes mellitus type 2 Resolving, anion gap closed, bicarb WNL, CBGs WNL Last A1c on 11/19 was 7.2 On presentation anion gap of 23, blood sugars in the 600 range, bicarb 14, LA 2.17 Status post insulin drip Start SSI, Lantus, Accu-Cheks Hold home Xigduo Advance diet Continue IV fluids Daily BMP  Hypernatremia/hyperchloremia Likely due to severe dehydration Continue IV fluids with  D5/1/4NS  Hypokalemia Please PRN  Left tonsillar squamous cell carcinoma  Status post tonsillectomy at [facility] about 3 weeks ago Continue pain management, nutrition consult Follows with ENT physician at [facility], has an appointment on November 19 for follow-up  Hyperlipidemia Continue Lipitor    Code Status: Full  Family Communication: Wife at bedside  Disposition Plan: Likely home once improvement   Consultants:  None  Procedures:  None  Antimicrobials:  None  DVT prophylaxis: Lovenox   Objective: Vitals:   07/13/18 0900 07/13/18 1000 07/13/18 1100 07/13/18 1200  BP:    (!) 156/72  Pulse: 99 91 90 79  Resp: 18 18 18 19   Temp:      TempSrc:      SpO2: 99% 98% 98% 96%  Weight:      Height:        Intake/Output Summary (Last 24 hours) at 07/13/2018 1356 Last data filed at 07/13/2018 1200 Gross per 24 hour  Intake 2709.36 ml  Output -  Net 2709.36 ml   Filed Weights   07/12/18 1019 07/12/18 2330  Weight: 102.5 kg 90.5 kg    Exam:   General: NAD, mildly lethargic  Cardiovascular: S1, S2 present  Respiratory: CTAB  Abdomen: Soft, nontender, nondistended, bowel sounds present  Musculoskeletal: No pedal edema bilaterally  Skin: Normal  Psychiatry: Normal mood   Data Reviewed: CBC: Recent Labs  Lab 07/12/18 1037 07/12/18 1208  WBC 14.0*  --   NEUTROABS 10.6*  --   HGB 19.3* 15.3  HCT 56.5* 45.0  MCV 91.4  --   PLT 352  --    Basic Metabolic Panel: Recent Labs  Lab 07/12/18 1037 07/12/18 1208 07/12/18  1355 07/12/18 1800 07/13/18 0141 07/13/18 0555  NA 149* 151* 154* 156* 158* 159*  K 4.0 4.2 3.8 3.5 3.4* 3.3*  CL 112* 118* 121* 124* 127* 127*  CO2 14*  --  17* 20* 25 26  GLUCOSE 602* 473* 386* 259* 197* 169*  BUN 82* 66* 61* 49* 45* 42*  CREATININE 1.54* 0.90 1.05 0.83 0.71 0.64  CALCIUM 10.3  --  9.2 8.9 9.0 9.1   GFR: Estimated Creatinine Clearance: 122.8 mL/min (by C-G formula based on SCr of 0.64 mg/dL). Liver  Function Tests: Recent Labs  Lab 07/12/18 1037  AST 13*  ALT 21  ALKPHOS 80  BILITOT 2.8*  PROT 7.5  ALBUMIN 4.2   No results for input(s): LIPASE, AMYLASE in the last 168 hours. No results for input(s): AMMONIA in the last 168 hours. Coagulation Profile: Recent Labs  Lab 07/12/18 1037  INR 1.03   Cardiac Enzymes: No results for input(s): CKTOTAL, CKMB, CKMBINDEX, TROPONINI in the last 168 hours. BNP (last 3 results) No results for input(s): PROBNP in the last 8760 hours. HbA1C: Recent Labs    07/13/18 0555  HGBA1C 7.2*   CBG: Recent Labs  Lab 07/13/18 0436 07/13/18 0553 07/13/18 0654 07/13/18 0812 07/13/18 1128  GLUCAP 145* 138* 163* 155* 251*   Lipid Profile: No results for input(s): CHOL, HDL, LDLCALC, TRIG, CHOLHDL, LDLDIRECT in the last 72 hours. Thyroid Function Tests: No results for input(s): TSH, T4TOTAL, FREET4, T3FREE, THYROIDAB in the last 72 hours. Anemia Panel: No results for input(s): VITAMINB12, FOLATE, FERRITIN, TIBC, IRON, RETICCTPCT in the last 72 hours. Urine analysis:    Component Value Date/Time   COLORURINE YELLOW 07/12/2018 [place] 07/12/2018 1228   LABSPEC 1.027 07/12/2018 1228   PHURINE 5.0 07/12/2018 1228   GLUCOSEU >=500 (A) 07/12/2018 1228   HGBUR NEGATIVE 07/12/2018 [place] 07/12/2018 1228   KETONESUR 80 (A) 07/12/2018 1228   PROTEINUR NEGATIVE 07/12/2018 1228   NITRITE NEGATIVE 07/12/2018 1228   LEUKOCYTESUR NEGATIVE 07/12/2018 1228   Sepsis Labs: @LABRCNTIP (procalcitonin:4,lacticidven:4)  ) Recent Results (from the past 240 hour(s))  Culture, blood (Routine x 2)     Status: None (Preliminary result)   Collection Time: 07/12/18 10:37 AM  Result Value Ref Range Status   Specimen Description BLOOD RIGHT ANTECUBITAL  Final   Special Requests   Final    BOTTLES DRAWN AEROBIC AND ANAEROBIC Blood Culture results may not be optimal due to an excessive volume of blood received in culture  bottles   Culture   Final    NO GROWTH < 24 HOURS Performed at [place] [address], [place], [place] [address]    Report Status PENDING  Incomplete  Culture, blood (Routine x 2)     Status: None (Preliminary result)   Collection Time: 07/12/18 10:40 AM  Result Value Ref Range Status   Specimen Description   Final    BLOOD LEFT HAND Performed at [facility] [address] [place], [place] [address]    Special Requests   Final    BOTTLES DRAWN AEROBIC AND ANAEROBIC Blood Culture results may not be optimal due to an inadequate volume of blood received in culture bottles   Culture   Final    NO GROWTH < 24 HOURS Performed at [facility] [address] [place], [place] [address]    Report Status PENDING  Incomplete  MRSA PCR Screening     Status: None  Collection Time: 07/13/18 12:02 AM  Result Value Ref Range Status   MRSA by PCR NEGATIVE NEGATIVE Final    Comment:        The GeneXpert MRSA Assay (FDA approved for NASAL specimens only), is one component of a comprehensive MRSA colonization surveillance program. It is not intended to diagnose MRSA infection nor to guide or monitor treatment for MRSA infections. Performed at [facility], [place] [address] [place], [place] [address]       Studies: No results found.  Scheduled Meds: . atorvastatin  20 mg Oral q1800  . enoxaparin (LOVENOX) injection  40 mg Subcutaneous Q24H  . feeding supplement (GLUCERNA SHAKE)  237 mL Oral TID BM  . insulin aspart  0-15 Units Subcutaneous TID WC  . insulin aspart  0-5 Units Subcutaneous QHS  . insulin glargine  5 Units Subcutaneous BID    Continuous Infusions: . dextrose 5 % and 0.2 % NaCl 100 mL/hr at 07/13/18 1200  . insulin Stopped (07/13/18 0749)     LOS: 1 day     Alma Friendly, MD [facility]  If 7PM-7AM, please contact night-coverage [email or web address] 07/13/2018, 1:56 PM

## 2018-07-14 DIAGNOSIS — E119 Type 2 diabetes mellitus without complications: Secondary | ICD-10-CM

## 2018-07-14 LAB — GLUCOSE, CAPILLARY
GLUCOSE-CAPILLARY: 190 mg/dL — AB (ref 70–99)
Glucose-Capillary: 231 mg/dL — ABNORMAL HIGH (ref 70–99)
Glucose-Capillary: 209 mg/dL — ABNORMAL HIGH (ref 70–99)
Glucose-Capillary: 167 mg/dL — ABNORMAL HIGH (ref 70–99)

## 2018-07-14 LAB — CBC WITH DIFFERENTIAL/PLATELET
BASOS ABS: 0 10*3/uL (ref 0.0–0.1)
Basophils Relative: 0 %
EOS PCT: 1 %
Eosinophils Absolute: 0.1 10*3/uL (ref 0.0–0.5)
HCT: 41.3 % (ref 39.0–52.0)
Hemoglobin: 13.5 g/dL (ref 13.0–17.0)
Lymphocytes Relative: 34 %
Lymphs Abs: 2.5 10*3/uL (ref 0.7–4.0)
MCH: 30 pg (ref 26.0–34.0)
MCHC: 32.7 g/dL (ref 30.0–36.0)
MCV: 91.8 fL (ref 80.0–100.0)
MONO ABS: 0.8 10*3/uL (ref 0.1–1.0)
Monocytes Relative: 11 %
Neutro Abs: 3.9 10*3/uL (ref 1.7–7.7)
Neutrophils Relative %: 53 %
Platelets: 166 10*3/uL (ref 150–400)
RBC: 4.5 MIL/uL (ref 4.22–5.81)
RDW: 13.2 % (ref 11.5–15.5)
WBC: 7.3 10*3/uL (ref 4.0–10.5)
nRBC: 0 % (ref 0.0–0.2)

## 2018-07-14 LAB — BASIC METABOLIC PANEL WITH GFR
Anion gap: 8 (ref 5–15)
BUN: 27 mg/dL — ABNORMAL HIGH (ref 6–20)
CO2: 28 mmol/L (ref 22–32)
Calcium: 8.5 mg/dL — ABNORMAL LOW (ref 8.9–10.3)
Chloride: 118 mmol/L — ABNORMAL HIGH (ref 98–111)
Creatinine, Ser: 0.63 mg/dL (ref 0.61–1.24)
GFR calc Af Amer: >60 mL/min (ref >60)
GFR calc non Af Amer: >60 mL/min (ref >60)
Glucose, Bld: 273 mg/dL — ABNORMAL HIGH (ref 70–99)
Potassium: 3.7 mmol/L (ref 3.5–5.1)
Sodium: 154 mmol/L — ABNORMAL HIGH (ref 135–145)

## 2018-07-14 MED ORDER — ORAL CARE MOUTH RINSE
15.0000 mL | Freq: Two times a day (BID) | OROMUCOSAL | Status: DC
  Administered 2018-07-14 (×2): 15 mL via OROMUCOSAL

## 2018-07-14 MED ORDER — INSULIN GLARGINE 100 UNIT/ML ~~LOC~~ SOLN
5.0000 [IU] | Freq: Once | SUBCUTANEOUS | Status: AC
  Administered 2018-07-14: 5 [IU] via SUBCUTANEOUS
  Filled 2018-07-14: qty 0.05

## 2018-07-14 MED ORDER — DEXTROSE 5 % IV SOLN
INTRAVENOUS | Status: DC
  Administered 2018-07-14 – 2018-07-15 (×2): via INTRAVENOUS

## 2018-07-14 MED ORDER — INSULIN GLARGINE 100 UNIT/ML ~~LOC~~ SOLN
10.0000 [IU] | Freq: Two times a day (BID) | SUBCUTANEOUS | Status: DC
  Administered 2018-07-14 – 2018-07-15 (×3): 10 [IU] via SUBCUTANEOUS
  Filled 2018-07-14 (×4): qty 0.1

## 2018-07-14 MED ORDER — SENNOSIDES-DOCUSATE SODIUM 8.6-50 MG PO TABS
1.0000 | ORAL_TABLET | Freq: Two times a day (BID) | ORAL | Status: DC
  Administered 2018-07-14: 1 via ORAL
  Filled 2018-07-14 (×2): qty 1

## 2018-07-14 MED ORDER — INSULIN ASPART 100 UNIT/ML ~~LOC~~ SOLN
5.0000 [IU] | Freq: Three times a day (TID) | SUBCUTANEOUS | Status: DC
  Administered 2018-07-14 – 2018-07-15 (×5): 5 [IU] via SUBCUTANEOUS

## 2018-07-14 MED ORDER — MAGIC MOUTHWASH W/LIDOCAINE
15.0000 mL | Freq: Three times a day (TID) | ORAL | Status: DC
  Administered 2018-07-14 – 2018-07-15 (×2): 15 mL via ORAL
  Filled 2018-07-14 (×4): qty 15

## 2018-07-14 MED ORDER — GLUCERNA SHAKE PO LIQD
237.0000 mL | Freq: Two times a day (BID) | ORAL | Status: DC
  Administered 2018-07-15: 237 mL via ORAL
  Filled 2018-07-14 (×2): qty 237

## 2018-07-14 MED ORDER — BOOST PLUS PO LIQD
237.0000 mL | Freq: Three times a day (TID) | ORAL | Status: DC
  Administered 2018-07-14: 237 mL via ORAL
  Filled 2018-07-14 (×5): qty 237

## 2018-07-14 NOTE — Progress Notes (Signed)
PROGRESS NOTE  Dustin Goodman YOV:[id number] DOB: 23-Jul-1966 DOA: 07/12/2018 PCP: Colon Branch, MD  HPI/Recap of past 24 hours: HPI from Dr Dorris Singh is a 52 y.o. 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  He recently had surgery at [facility] for his tonsilar cancer by his ENT surgeon and was discharged on last Wednesday.  He also reported that he was on Decadron during his hospitalization.  Since his discharge, he had extreme difficulty eating and drinking mainly because of pain due to surgery.  He has loss of appetite.  Nausea and vomiting gradually worsened. In the ED, pt was found to be in DKA.  Noted to be hypertensive and tachycardic and severely dehydrated on presentation.  He was dry as bone. Pt was admitted for further management.   Today, pt reported feeling better, denies any new complaints. Still with poor oral intake due to odynophagia.   Assessment/Plan: Principal Problem:   DKA (diabetic ketoacidoses) ([place]) Active Problems:   DMII (diabetes mellitus, type 2) (HCC)   High cholesterol   Hypernatremia   Dehydration   Lactic acid acidosis  DKA with a history of diabetes mellitus type 2 Resolving, anion gap closed, bicarb WNL, CBGs WNL Last A1c on 11/19 was 7.2 On presentation anion gap of 23, blood sugars in the 600 range, bicarb 14, LA 2.17 Status post insulin drip Start SSI, Lantus, Accu-Cheks Hold home Xigduo Advance diet Continue IV fluids Daily BMP  Hypernatremia/hyperchloremia Likely due to severe dehydration Switch IV fluids to D5W only Daily BMP  Hypokalemia Replace prn  Left tonsillar squamous cell carcinoma  Status post tonsillectomy at [facility] about 3  weeks ago Continue pain management, nutrition consult Follows with ENT physician at [facility], has an appointment on November 19 for follow-up  Hyperlipidemia Continue Lipitor    Code Status: Full  Family Communication: Wife at bedside  Disposition Plan: Likely home once improvement   Consultants:  None  Procedures:  None  Antimicrobials:  None  DVT prophylaxis: Lovenox   Objective: Vitals:   07/14/18 1200 07/14/18 1300 07/14/18 1400 07/14/18 1500  BP: (!) 160/72  (!) 159/78   Pulse: 68 67 66 84  Resp: 13 15 15 14   Temp:      TempSrc:      SpO2: 100% 100% 100% 99%  Weight:      Height:        Intake/Output Summary (Last 24 hours) at 07/14/2018 1628 Last data filed at 07/14/2018 1400 Gross per 24 hour  Intake 2625.8 ml  Output 500 ml  Net 2125.8 ml   Filed Weights   07/12/18 1019 07/12/18 2330  Weight: 102.5 kg 90.5 kg    Exam:  General: NAD   Cardiovascular: S1, S2 present  Respiratory: CTAB  Abdomen: Soft, nontender, nondistended, bowel sounds present  Musculoskeletal: No bilateral pedal edema noted  Skin: Normal  Psychiatry: Normal mood   Data Reviewed: CBC: Recent Labs  Lab 07/12/18 1037 07/12/18 1208 07/14/18 0314  WBC 14.0*  --  7.3  NEUTROABS 10.6*  --  3.9  HGB 19.3* 15.3 13.5  HCT 56.5* 45.0 41.3  MCV 91.4  --  91.8  PLT 352  --  741   Basic Metabolic Panel: Recent Labs  Lab 07/12/18 1355 07/12/18 1800  07/13/18 0141 07/13/18 0555 07/14/18 0314  NA 154* 156* 158* 159* 154*  K 3.8 3.5 3.4* 3.3* 3.7  CL 121* 124* 127* 127* 118*  CO2 17* 20* 25 26 28   GLUCOSE 386* 259* 197* 169* 273*  BUN 61* 49* 45* 42* 27*  CREATININE 1.05 0.83 0.71 0.64 0.63  CALCIUM 9.2 8.9 9.0 9.1 8.5*   GFR: Estimated Creatinine Clearance: 122.8 mL/min (by C-G formula based on SCr of 0.63 mg/dL). Liver Function Tests: Recent Labs  Lab 07/12/18 1037  AST 13*  ALT 21  ALKPHOS 80  BILITOT 2.8*  PROT 7.5  ALBUMIN 4.2   No results  for input(s): LIPASE, AMYLASE in the last 168 hours. No results for input(s): AMMONIA in the last 168 hours. Coagulation Profile: Recent Labs  Lab 07/12/18 1037  INR 1.03   Cardiac Enzymes: No results for input(s): CKTOTAL, CKMB, CKMBINDEX, TROPONINI in the last 168 hours. BNP (last 3 results) No results for input(s): PROBNP in the last 8760 hours. HbA1C: Recent Labs    07/13/18 0555  HGBA1C 7.2*   CBG: Recent Labs  Lab 07/13/18 1128 07/13/18 1632 07/13/18 2217 07/14/18 0817 07/14/18 1129  GLUCAP 251* 253* 246* 231* 209*   Lipid Profile: No results for input(s): CHOL, HDL, LDLCALC, TRIG, CHOLHDL, LDLDIRECT in the last 72 hours. Thyroid Function Tests: No results for input(s): TSH, T4TOTAL, FREET4, T3FREE, THYROIDAB in the last 72 hours. Anemia Panel: No results for input(s): VITAMINB12, FOLATE, FERRITIN, TIBC, IRON, RETICCTPCT in the last 72 hours. Urine analysis:    Component Value Date/Time   COLORURINE YELLOW 07/12/2018 [place] 07/12/2018 1228   LABSPEC 1.027 07/12/2018 1228   PHURINE 5.0 07/12/2018 1228   GLUCOSEU >=500 (A) 07/12/2018 1228   HGBUR NEGATIVE 07/12/2018 [place] 07/12/2018 1228   KETONESUR 80 (A) 07/12/2018 1228   PROTEINUR NEGATIVE 07/12/2018 1228   NITRITE NEGATIVE 07/12/2018 1228   LEUKOCYTESUR NEGATIVE 07/12/2018 1228   Sepsis Labs: @LABRCNTIP (procalcitonin:4,lacticidven:4)  ) Recent Results (from the past 240 hour(s))  Culture, blood (Routine x 2)     Status: None (Preliminary result)   Collection Time: 07/12/18 10:37 AM  Result Value Ref Range Status   Specimen Description BLOOD RIGHT ANTECUBITAL  Final   Special Requests   Final    BOTTLES DRAWN AEROBIC AND ANAEROBIC Blood Culture results may not be optimal due to an excessive volume of blood received in culture bottles   Culture   Final    NO GROWTH 2 DAYS Performed at [facility], [place] [address] [place], [place] [address]    Report  Status PENDING  Incomplete  Culture, blood (Routine x 2)     Status: None (Preliminary result)   Collection Time: 07/12/18 10:40 AM  Result Value Ref Range Status   Specimen Description   Final    BLOOD LEFT HAND Performed at [place] [address], [place], [place] [address]    Special Requests   Final    BOTTLES DRAWN AEROBIC AND ANAEROBIC Blood Culture results may not be optimal due to an inadequate volume of blood received in culture bottles   Culture   Final    NO GROWTH 2 DAYS Performed at [facility], [place] [address], [place], [place] [address]    Report Status PENDING  Incomplete  MRSA PCR Screening     Status: None   Collection Time: 07/13/18 12:02 AM  Result Value Ref Range Status  MRSA by PCR NEGATIVE NEGATIVE Final    Comment:        The GeneXpert MRSA Assay (FDA approved for NASAL specimens only), is one component of a comprehensive MRSA colonization surveillance program. It is not intended to diagnose MRSA infection nor to guide or monitor treatment for MRSA infections. Performed at [facility], [place] [address], [place], [place] [address]       Studies: No results found.  Scheduled Meds: . atorvastatin  20 mg Oral q1800  . enoxaparin (LOVENOX) injection  40 mg Subcutaneous Q24H  . [START ON 07/15/2018] feeding supplement (GLUCERNA SHAKE)  237 mL Oral BID BM  . insulin aspart  0-15 Units Subcutaneous TID WC  . insulin aspart  0-5 Units Subcutaneous QHS  . insulin aspart  5 Units Subcutaneous TID WC  . insulin glargine  10 Units Subcutaneous BID  . lactose free nutrition  237 mL Oral TID WC  . magic mouthwash w/lidocaine  15 mL Oral TID  . mouth rinse  15 mL Mouth Rinse BID    Continuous Infusions: . dextrose 5 % and 0.2 % NaCl 100 mL/hr at 07/14/18 1400     LOS: 2 days     Alma Friendly, MD [facility]  If 7PM-7AM, please contact night-coverage [email or web address] 07/14/2018, 4:28 PM

## 2018-07-14 NOTE — Care Management Note (Signed)
Case Management Note  Patient Details  Name: Dustin Goodman MRN: [id number] Date of Birth: 1966-02-16  Subjective/Objective:                  52 y.o.malewith medical history significantfor recently diagnosedpoorly differentiatedsquamouscell carcinoma of the tonsil S/Presection surgery at [facility],diabetes type 2 ,hyperlipidemia who presents the emergency department today from home with complaints of nausea, vomiting and increased generalized weakness. He recently had surgery at [facility] for his tonsilar cancerby his ENT surgeonand was discharged on last Wednesday.He also reported that he was on Decadron during his hospitalization.Since his discharge, he had extreme difficulty eating and drinking mainly because of pain due to surgery. He has loss of appetite. Nausea and vomiting gradually worsened. In the ED, pt was found to be inDKA.Noted to be hypertensive and tachycardic and severely dehydrated on presentation.He was dry as bone. Pt was admitted for further management.  Action/Plan: Following for progression of care. Following for cm needs none present at this time.  Expected Discharge Date:  (unknown)               Expected Discharge Plan:  Home/Self Care  In-House Referral:     Discharge planning Services  CM Consult  Post Acute Care Choice:    Choice offered to:     DME Arranged:    DME Agency:     HH Arranged:    HH Agency:     Status of Service:  In process, will continue to follow  If discussed at Long Length of Stay Meetings, dates discussed:    Additional Comments:  Leeroy Cha, RN 07/14/2018, 11:00 AM

## 2018-07-14 NOTE — Evaluation (Signed)
Clinical/Bedside Swallow Evaluation Patient Details  Name: Dustin Goodman MRN: [id number] Date of Birth: Mar 02, 1966  Today's Date: 07/14/2018 Time: SLP Start Time (ACUTE ONLY): 0950 SLP Stop Time (ACUTE ONLY): 1010 SLP Time Calculation (min) (ACUTE ONLY): 20 min  Past Medical History:  Past Medical History:  Diagnosis Date  . Allergy    seasonal  . Diabetes mellitus 1998   type II  . Hyperlipidemia    Past Surgical History:  Past Surgical History:  Procedure Laterality Date  . CHOLECYSTECTOMY  1998  . SHOULDER ARTHROSCOPY Right 2010    Dr Onnie Graham    HPI:  52 yo male adm to [facility] with DKA, odynophagia recently diagnosed (Oct 2019) with left tonsillar cancer level 2 mets s/p surgery at [facility] 10 days prior to admit.  Swallow evaluation ordered.  CXR negative.     Assessment / Plan / Recommendation Clinical Impression  Limited evaluation done due to pt's level of odynophagia.  Suspect oropharyngeal dysphagia may be present after healing due to region of surgery and appearance of portion of soft palate removed.  However hopeful it will remain functional.  Advised pt to monitor for nasal regurgitation of liquids.  SlP observed pt consuming water via straw with good tolerance.  He states he does not desire solids due to attempting jello and it mixing with secretions in pharynx.  Much of session was focused on reviewing recommendation to avoid hot, spicy, acidic items and importance of continuing intake during treatement for nutrition and to decrease swallowing muscle atrophy.  Pt admits to occasionally "choking" on liquids - advised he consider chin tuck and/or head turn left to close off surgical side if helpful.  Reviewed xerostomia precautions and heimlich manuever information provided.    ? Could pt receive a suspension to consume before meals to help with discomfort *? Magic mouthwash?  No SlP follow up at this time as care plan not established (eg ? XRT and/or chemo, etc) and pt is  managing his dysphagia.  Please order SLP for dysphagia management when care plan established.  Thanks.  SLP Visit Diagnosis: Dysphagia, oropharyngeal phase (R13.12)    Aspiration Risk  Mild aspiration risk;Risk for inadequate nutrition/hydration    Diet Recommendation Thin liquid(diet as tolerated given pt awareness to dysphagia)   Medication Administration: Whole meds with liquid(encouraged to ask pharmacist if problematic) Supervision: Patient able to self feed Compensations: Slow rate;Small sips/bites(start with liquids, drink liquids t/o meal) Postural Changes: Seated upright at 90 degrees;Remain upright for at least 30 minutes after po intake    Other  Recommendations Oral Care Recommendations: Oral care BID   Follow up Recommendations None      Frequency and Duration   n/a         Prognosis   n/a     Swallow Study   General Date of Onset: 07/14/18 HPI: 52 yo male adm to [facility] with DKA, odynophagia recently diagnosed (Oct 2019) with left tonsillar cancer level 2 mets s/p surgery at [facility] 10 days prior to admit.  Swallow evaluation ordered.  CXR negative.   Type of Study: Bedside Swallow Evaluation Diet Prior to this Study: Regular;Thin liquids(carb mod) Temperature Spikes Noted: No Respiratory Status: Room air History of Recent Intubation: No Behavior/Cognition: Alert;Cooperative;Pleasant mood Oral Cavity Assessment: Other (comment)(obvious surgical changes present,  portion of soft palate excised) Oral Care Completed by SLP: No Oral Cavity - Dentition: Adequate natural dentition Vision: Functional for self-feeding Self-Feeding Abilities: Able to feed self Patient Positioning: Upright in bed Baseline  Vocal Quality: Normal(pt reports is decreased strength) Volitional Cough: (dnt due to pt's pain) Volitional Swallow: Able to elicit    Oral/Motor/Sensory Function Overall Oral Motor/Sensory Function: Within functional limits(partial palate removed and obvious surgical  changes tonsillar area)   Ice Chips Ice chips: Not tested   Thin Liquid Thin Liquid: Within functional limits Presentation: Straw;Self Fed    Nectar Thick Nectar Thick Liquid: Not tested   Honey Thick Honey Thick Liquid: Not tested   Puree Puree: Not tested Other Comments: pt advised he was having a hard time with jello as he felt it was sticking in his throat with secretions   Solid     Solid: Not tested Other Comments: pt was not consuming any solids due to odynophagia      Macario Golds 07/14/2018,10:51 AM  Luanna Salk, MS [place] Pager [phone] Office [phone]

## 2018-07-14 NOTE — Progress Notes (Signed)
Initial Nutrition Assessment  DOCUMENTATION CODES:   Not applicable  INTERVENTION:  - Continue Glucerna Shake but will decrease to BID, each supplement provides 220 kcal and 10 grams of protein. - Will order Boost Plus TID, each supplement provides 360 kcal and 14 grams of protein. - Continue to encourage PO intakes.    NUTRITION DIAGNOSIS:   Inadequate oral intake related to nausea, vomiting, wound healing, early satiety, mouth pain as evidenced by per patient/family report.  GOAL:   Patient will meet greater than or equal to 90% of their needs  MONITOR:   PO intake, Supplement acceptance, Weight trends, Labs  REASON FOR ASSESSMENT:   Consult Assessment of nutrition requirement/status  ASSESSMENT:   52 y.o. 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 He presented to the ED with complaints of N/V and increased generalized weakness. Since his discharge, he had extreme difficulty eating and drinking mainly because of pain due to surgery. Nausea and vomiting gradually worsened. In the ED, patient was found to be in DKA.  BMI indicates overweight status. Lunch tray at the bedside: patient had taken a few bites each of pudding and ice cream. He reports that he had surgery on 11/4 and that he was d/c'ed home on 11/7. Since that time, he has only been able to consume thin liquids. He was drinking 2-3 each of Glucerna Shakes and Boost/day (5 shakes total/day), Gatorade, and water.   He had tried creamier items but reported that he had difficulty swallowing them, feeling like these items got stuck in his throat.  Patient reports some pain when he initially starts to drink an item but that this subsides after 1-2 swallows. He is not having any temperature sensitivity although cold items are slightly better tolerated.   Patient and wife, who is at  bedside, report that he had a very good appetite prior to surgery. Wife states that he was eating more than usual in the days leading up to surgery, knowing he would not be able to eat well after.   Patient and wife report frustration that no diet advancement recommendations or food choices recommendations were provided following surgery. Talked with patient about listening to his body. To more consistently try full liquids/creamy items and that once these are well tolerated to try very soft items. Provided examples and also provided mainly protein examples after discussing the importance of protein for healing as well as glycemic control. Patient and wife very appreciative.    Medications reviewed; sliding scale Novolog, 5 units Novolog TID, 10 units Lantus BID. Labs reviewed; CBGs: 231 and 209 mg/dL today, Na: 154 mmol/L, Cl: 118 mmol/L, BUN: 27 mg/dL, Ca: 8.5 mg/dL. IVF; D5-1/4 NS @ 100 mL/hr (408 kcal).     NUTRITION - FOCUSED PHYSICAL EXAM:  Completed; no muscle and no fat wasting.  Diet Order:   Diet Order            Diet Carb Modified Fluid consistency: Thin; Room service appropriate? Yes  Diet effective now              EDUCATION NEEDS:   Education needs have been addressed  Skin:  Skin Assessment: Reviewed RN Assessment  Last BM:  11/12  Height:   Ht Readings from Last 1 Encounters:  07/12/18 5\' 9"  (1.753 m)    Weight:   Wt Readings from Last 1 Encounters:  07/12/18 90.5 kg  Ideal Body Weight:  72.73 kg  BMI:  Body mass index is 29.46 kg/m.  Estimated Nutritional Needs:   Kcal:  5974-1638 (26-28 kcal/kg)  Protein:  125-135 grams  Fluid:  >/= 2.5 L/day     Dustin Matin, MS, RD, LDN, [facility] Inpatient Clinical Dietitian Pager # [phone] After hours/weekend pager # [phone]

## 2018-07-15 LAB — BASIC METABOLIC PANEL
ANION GAP: 6 (ref 5–15)
BUN: 19 mg/dL (ref 6–20)
CALCIUM: 8.3 mg/dL — AB (ref 8.9–10.3)
CO2: 32 mmol/L (ref 22–32)
Chloride: 106 mmol/L (ref 98–111)
Creatinine, Ser: 0.54 mg/dL — ABNORMAL LOW (ref 0.61–1.24)
Glucose, Bld: 203 mg/dL — ABNORMAL HIGH (ref 70–99)
POTASSIUM: 3 mmol/L — AB (ref 3.5–5.1)
Sodium: 144 mmol/L (ref 135–145)

## 2018-07-15 LAB — GLUCOSE, CAPILLARY
GLUCOSE-CAPILLARY: 144 mg/dL — AB (ref 70–99)
Glucose-Capillary: 194 mg/dL — ABNORMAL HIGH (ref 70–99)

## 2018-07-15 MED ORDER — SENNOSIDES-DOCUSATE SODIUM 8.6-50 MG PO TABS: 1.0000 | ORAL_TABLET | Freq: Every day | ORAL | 0 refills | Status: AC

## 2018-07-15 MED ORDER — POTASSIUM CHLORIDE 10 MEQ/100ML IV SOLN
10.0000 meq | INTRAVENOUS | Status: AC
  Administered 2018-07-15 (×4): 10 meq via INTRAVENOUS
  Filled 2018-07-15 (×3): qty 100

## 2018-07-15 MED ORDER — POTASSIUM CHLORIDE CRYS ER 20 MEQ PO TBCR
40.0000 meq | EXTENDED_RELEASE_TABLET | Freq: Once | ORAL | Status: DC
  Filled 2018-07-15: qty 2

## 2018-07-15 NOTE — Care Management Note (Signed)
Case Management Note  Patient Details  Name: Dustin Goodman MRN: [id number] Date of Birth: 1966/02/03  Subjective/Objective:                  discharged  Action/Plan: Discharged to home with self-care, orders checked for hhc needs. No CM needs present at time of discharge.  Expected Discharge Date:  07/15/18               Expected Discharge Plan:  Home/Self Care  In-House Referral:     Discharge planning Services  CM Consult  Post Acute Care Choice:    Choice offered to:     DME Arranged:    DME Agency:     HH Arranged:    HH Agency:     Status of Service:  Completed, signed off  If discussed at [employer] of Stay Meetings, dates discussed:    Additional Comments:  Leeroy Cha, RN 07/15/2018, 1:14 PM

## 2018-07-15 NOTE — Discharge Summary (Signed)
Discharge Summary  Dustin Goodman TDV:[id number] DOB: 12-Feb-1966  PCP: Colon Branch, MD  Admit date: 07/12/2018 Discharge date: 07/15/2018  Time spent: 35 mins   Recommendations for Outpatient Follow-up:  1. PCP in 1 week  Discharge Diagnoses:  Active Hospital Problems   Diagnosis Date Noted  . DKA (diabetic ketoacidoses) ([place]) 07/12/2018  . Hypernatremia 07/12/2018  . Dehydration 07/12/2018  . Lactic acid acidosis 07/12/2018  . High cholesterol 09/13/2009  . DMII (diabetes mellitus, type 2) ([place]) 01/13/2007    Resolved Hospital Problems  No resolved problems to display.    Discharge Condition: Stable  Diet recommendation: Mod carb  Vitals:   07/15/18 0900 07/15/18 1000  BP:  (!) 160/81  Pulse: 72 68  Resp: 17 12  Temp:    SpO2: 100% 99%    History of present illness:  Dustin Hollern Harkleroadis a 52 y.o.malewith medical history significantfor recently diagnosedpoorly differentiatedsquamouscell carcinoma of the tonsil S/Presection surgery at [facility],diabetes type 2 ,hyperlipidemia who presents the emergency department today from home with complaints of nausea, vomiting and increased generalized weakness. He recently had surgery at [facility] for his tonsilar cancerby his ENT surgeonand was discharged on last Wednesday.He also reported that he was on Decadron during his hospitalization.Since his discharge, he had extreme difficulty eating and drinking mainly because of pain due to surgery. He has loss of appetite. Nausea and vomiting gradually worsened. In the ED, pt was found to be inDKA.Noted to be hypertensive and tachycardic and severely dehydrated on presentation.He was dry as bone. Pt was admitted for further management.   Today, pt reported feeling much better, appetite is much improved. Denies any new complaints. Very eager to be discharged. Advised close follow up with ENT at [facility] and PCP to follow up his DM and repeat labs.    Hospital Course:  Principal Problem:   DKA (diabetic ketoacidoses) ([place]) Active Problems:   DMII (diabetes mellitus, type 2) ([facility])   High cholesterol   Hypernatremia   Dehydration   Lactic acid acidosis  DKA with a history of diabetes mellitus type 2 Resolved, anion gap closed, bicarb WNL, CBGs WNL Last A1c on 11/19 was 7.2 On presentation anion gap of 23, blood sugars in the 600 range, bicarb 14, LA 2.17 Status post insulin drip, SSI, Lantus, Accu-Cheks Continue home Xigduo Follow up with PCP to monitor blood glucose and repeat labs  Hypernatremia/hyperchloremia 2/2 to severe dehydration Resolved, s/p IVF PCP to follow up with repeat labs  Hypokalemia Replace prn  Left tonsillar squamous cell carcinoma  Status post tonsillectomy at [facility] about 3 weeks ago Continue pain management Follows with ENT physician at [facility], has an appointment on November 19 for follow-up  Hyperlipidemia Continue Lipitor   Procedures:  None  Consultations:  None  Discharge Exam: BP (!) 160/81   Pulse 68   Temp 98.2 F (36.8 C) (Oral)   Resp 12   Ht 5\' 9"  (1.753 m)   Wt 90.5 kg   SpO2 99%   BMI 29.46 kg/m   General: NAD  Cardiovascular: S1, S2 present  Respiratory: CTAB   Discharge Instructions You were cared for by a hospitalist during your hospital stay. If you have any questions about your discharge medications or the care you received while you were in the hospital after you are discharged, you can call the unit and asked to speak with the hospitalist on call if the hospitalist that took care of you is not available. Once you are discharged, your primary  care physician will handle any further medical issues. Please note that NO REFILLS for any discharge medications will be authorized once you are discharged, as it is imperative that you return to your primary care physician (or establish a relationship with a primary care physician if you do not have one) for your  aftercare needs so that they can reassess your need for medications and monitor your lab values.   Allergies as of 07/15/2018      Reactions   Dust Mite Extract Hives   Takes Xyzal   Ibuprofen Hives      Medication List    STOP taking these medications   ONE TOUCH ULTRA TEST test strip Generic drug:  glucose blood     TAKE these medications   atorvastatin 20 MG tablet Commonly known as:  LIPITOR Take 1 tablet (20 mg total) by mouth daily.   CENTRUM PO Take 1 tablet by mouth daily.   levocetirizine 5 MG tablet Commonly known as:  XYZAL Take 1 tablet (5 mg total) by mouth daily.   oxyCODONE 5 MG immediate release tablet Commonly known as:  Oxy IR/ROXICODONE Take 2.5 mg by mouth every 6 (six) hours as needed for pain.   senna-docusate 8.6-50 MG tablet Commonly known as:  Senokot-S Take 1 tablet by mouth at bedtime for 14 days.   sodium fluoride 1.1 % Crea dental cream Commonly known as:  PREVIDENT 5000 PLUS Apply to tooth brush. Brush teeth for 2 minutes. Spit out excess. DO NOT rinse afterwards. Repeat nightly.   XIGDUO XR 12-998 MG Tb24 Generic drug:  Dapagliflozin-metFORMIN HCl ER Take 2 tablets by mouth daily with breakfast.      Allergies  Allergen Reactions  . Dust Mite Extract Hives    Takes Xyzal  . Ibuprofen Hives   Follow-up Information    Colon Branch, MD. Schedule an appointment as soon as possible for a visit in 1 week(s).   Specialty:  Internal Medicine Contact information: [address] [place] [address] [phone]            The results of significant diagnostics from this hospitalization (including imaging, microbiology, ancillary and laboratory) are listed below for reference.    Significant Diagnostic Studies: Dg Chest 2 View  Result Date: 07/12/2018 CLINICAL DATA:  Weakness, lethargy over the last few days, history of carcinoma throat EXAM: CHEST - 2 VIEW COMPARISON:  Chest x-ray of 03/16/2016 FINDINGS: No  active infiltrate or effusion is seen. Mediastinal and hilar contours unremarkable. The heart is within upper limits of normal. There are degenerative changes in the mid to lower thoracic spine. IMPRESSION: No active cardiopulmonary disease. Electronically Signed   By: Ivar Drape M.D.   On: 07/12/2018 12:06    Microbiology: Recent Results (from the past 240 hour(s))  Culture, blood (Routine x 2)     Status: None (Preliminary result)   Collection Time: 07/12/18 10:37 AM  Result Value Ref Range Status   Specimen Description BLOOD RIGHT ANTECUBITAL  Final   Special Requests   Final    BOTTLES DRAWN AEROBIC AND ANAEROBIC Blood Culture results may not be optimal due to an excessive volume of blood received in culture bottles   Culture   Final    NO GROWTH 3 DAYS Performed at [facility] [address] [place], [place] [address]    Report Status PENDING  Incomplete  Culture, blood (Routine x 2)     Status: None (Preliminary result)  Collection Time: 07/12/18 10:40 AM  Result Value Ref Range Status   Specimen Description   Final    BLOOD LEFT HAND Performed at [facility] [address] [place], [place] [address]    Special Requests   Final    BOTTLES DRAWN AEROBIC AND ANAEROBIC Blood Culture results may not be optimal due to an inadequate volume of blood received in culture bottles   Culture   Final    NO GROWTH 3 DAYS Performed at [facility], [place] [address], [place], [place] [address]    Report Status PENDING  Incomplete  MRSA PCR Screening     Status: None   Collection Time: 07/13/18 12:02 AM  Result Value Ref Range Status   MRSA by PCR NEGATIVE NEGATIVE Final    Comment:        The GeneXpert MRSA Assay (FDA approved for NASAL specimens only), is one component of a comprehensive MRSA colonization surveillance program. It is not intended to diagnose MRSA infection nor to guide or monitor treatment for MRSA infections. Performed at [facility], [place] [address], [place], [place] [address]      Labs: Basic Metabolic Panel: Recent Labs  Lab 07/12/18 1800 07/13/18 0141 07/13/18 0555 07/14/18 0314 07/15/18 0335  NA 156* 158* 159* 154* 144  K 3.5 3.4* 3.3* 3.7 3.0*  CL 124* 127* 127* 118* 106  CO2 20* 25 26 28  32  GLUCOSE 259* 197* 169* 273* 203*  BUN 49* 45* 42* 27* 19  CREATININE 0.83 0.71 0.64 0.63 0.54*  CALCIUM 8.9 9.0 9.1 8.5* 8.3*   Liver Function Tests: Recent Labs  Lab 07/12/18 1037  AST 13*  ALT 21  ALKPHOS 80  BILITOT 2.8*  PROT 7.5  ALBUMIN 4.2   No results for input(s): LIPASE, AMYLASE in the last 168 hours. No results for input(s): AMMONIA in the last 168 hours. CBC: Recent Labs  Lab 07/12/18 1037 07/12/18 1208 07/14/18 0314  WBC 14.0*  --  7.3  NEUTROABS 10.6*  --  3.9  HGB 19.3* 15.3 13.5  HCT 56.5* 45.0 41.3  MCV 91.4  --  91.8  PLT 352  --  166   Cardiac Enzymes: No results for input(s): CKTOTAL, CKMB, CKMBINDEX, TROPONINI in the last 168 hours. BNP: BNP (last 3 results) No results for input(s): BNP in the last 8760 hours.  ProBNP (last 3 results) No results for input(s): PROBNP in the last 8760 hours.  CBG: Recent Labs  Lab 07/14/18 0817 07/14/18 1129 07/14/18 1650 07/14/18 2203 07/15/18 0751  GLUCAP 231* 209* 190* 167* 194*       Signed:  Alma Friendly, MD [facility] 07/15/2018, 12:08 PM

## 2018-07-18 ENCOUNTER — Telehealth: Payer: Self-pay

## 2018-07-18 NOTE — Telephone Encounter (Signed)
07/18/18  Transition Care Management Follow-up Telephone Call  ADMISSION DATE: 07/12/18  DISCHARGE DATE: 07/15/18   How have you been since you were released from the hospital? Feeling better although still has some weakness.    Do you understand why you were in the hospital? Yes severe dehydration and his Blood Sugar levels were off.   Do you understand the discharge instrcutions? Yes    Items Reviewed:  Medications reviewed: Patient states he has not take Senna-docusate and Xigduo XR 1000 mg states the tablets are too big for him to swallow and cannot be crushed.   Allergies reviewed: Yes  Dietary changes reviewed: Modified Carbohyrate  Referrals reviewed: Appointemnt scheduled   Functional Questionnaire:   Activities of Daily Living (ADLs): Patient states he can perform all ADLs independently  Any patient concerns? Would like to establish a method of checking his blood sugar levels. One topuch ul;tra test strips were discontinued per discharge summary.    Confirmed importance and date/time of follow-up visits scheduled: Yes   Confirmed with patient if condition begins to worsen call PCP or go to the ER. Yes    Patient was given the office number and encouragred to call back with questions or concerns. Yes

## 2018-07-18 NOTE — Telephone Encounter (Signed)
Hospital follow up call made to patient. No answer,left messager for return call.

## 2018-07-19 LAB — CULTURE, BLOOD (ROUTINE X 2)
Culture: NO GROWTH
Culture: NO GROWTH

## 2018-07-21 ENCOUNTER — Encounter: Payer: Self-pay | Admitting: *Deleted

## 2018-07-21 NOTE — Progress Notes (Signed)
[facility]osocial Distress Screening Clinical Social Work  Clinical Social Work was referred by distress screening protocol.  The patient scored a 8 on the Psychosocial Distress Thermometer which indicates severe distress. Clinical Social Worker attempted contacted patient by phone to assess for distress and other psychosocial needs. Patient did not answer, and has no scheduled follow up at [facility].  Patient is being followed by head & neck nurse navigator.  CSW requested patient return call.  ONCBCN DISTRESS SCREENING 06/28/2018  Screening Type Initial Screening  Distress experienced in past week (1-10) 8  Practical problem type Insurance;Work/school  Family Problem type Children  Emotional problem type Adjusting to illness  Physical Problem type Constipation/diarrhea      Gwinda Maine, LCSW

## 2018-07-25 ENCOUNTER — Ambulatory Visit (INDEPENDENT_AMBULATORY_CARE_PROVIDER_SITE_OTHER): Payer: 59 | Admitting: Internal Medicine

## 2018-07-25 ENCOUNTER — Encounter: Payer: Self-pay | Admitting: Internal Medicine

## 2018-07-25 VITALS — BP 124/64 | HR 69 | Temp 97.9°F | Resp 16 | Ht 69.0 in | Wt 207.4 lb

## 2018-07-25 DIAGNOSIS — E119 Type 2 diabetes mellitus without complications: Secondary | ICD-10-CM | POA: Diagnosis not present

## 2018-07-25 DIAGNOSIS — C099 Malignant neoplasm of tonsil, unspecified: Secondary | ICD-10-CM

## 2018-07-25 DIAGNOSIS — E1110 Type 2 diabetes mellitus with ketoacidosis without coma: Secondary | ICD-10-CM

## 2018-07-25 LAB — CBC WITH DIFFERENTIAL/PLATELET
BASOS ABS: 0 10*3/uL (ref 0.0–0.1)
Basophils Relative: 0.5 % (ref 0.0–3.0)
EOS ABS: 0.1 10*3/uL (ref 0.0–0.7)
Eosinophils Relative: 1.2 % (ref 0.0–5.0)
HEMATOCRIT: 41.2 % (ref 39.0–52.0)
Hemoglobin: 14.3 g/dL (ref 13.0–17.0)
LYMPHS PCT: 26.5 % (ref 12.0–46.0)
Lymphs Abs: 2 10*3/uL (ref 0.7–4.0)
MCHC: 34.8 g/dL (ref 30.0–36.0)
MCV: 88.4 fl (ref 78.0–100.0)
MONO ABS: 0.7 10*3/uL (ref 0.1–1.0)
Monocytes Relative: 9 % (ref 3.0–12.0)
NEUTROS ABS: 4.7 10*3/uL (ref 1.4–7.7)
Neutrophils Relative %: 62.8 % (ref 43.0–77.0)
PLATELETS: 341 10*3/uL (ref 150.0–400.0)
RBC: 4.66 Mil/uL (ref 4.22–5.81)
RDW: 12.8 % (ref 11.5–15.5)
WBC: 7.5 10*3/uL (ref 4.0–10.5)

## 2018-07-25 LAB — COMPREHENSIVE METABOLIC PANEL
ALK PHOS: 80 U/L (ref 39–117)
ALT: 22 U/L (ref 0–53)
AST: 13 U/L (ref 0–37)
Albumin: 3.9 g/dL (ref 3.5–5.2)
BILIRUBIN TOTAL: 0.5 mg/dL (ref 0.2–1.2)
BUN: 10 mg/dL (ref 6–23)
CO2: 30 mEq/L (ref 19–32)
CREATININE: 0.55 mg/dL (ref 0.40–1.50)
Calcium: 9.4 mg/dL (ref 8.4–10.5)
Chloride: 102 mEq/L (ref 96–112)
GFR: 167.47 mL/min (ref >60.00)
GLUCOSE: 204 mg/dL — AB (ref 70–99)
POTASSIUM: 4.5 meq/L (ref 3.5–5.1)
SODIUM: 139 meq/L (ref 135–145)
TOTAL PROTEIN: 6.3 g/dL (ref 6.0–8.3)

## 2018-07-25 MED ORDER — ONETOUCH LANCETS MISC: 12 refills | Status: AC

## 2018-07-25 MED ORDER — GLUCOSE BLOOD VI STRP: ORAL_STRIP | 12 refills | Status: AC

## 2018-07-25 MED ORDER — ONETOUCH VERIO W/DEVICE KIT: PACK | 0 refills | Status: AC

## 2018-07-25 NOTE — Patient Instructions (Signed)
GO TO THE LAB : Get the blood work     GO TO THE FRONT DESK Schedule your next appointment for a  Check up in 6 months  Diabetes: Check your blood sugar  once a day or more, at different times of the day  GOALS: Fasting before a meal 70- 130 2 hours after a meal less than 180

## 2018-07-25 NOTE — Progress Notes (Signed)
Subjective:    Patient ID: Dustin Goodman, male    DOB: [birth year], 52 y.o.   MRN: [id number]  DOS:  07/25/2018 Type of visit - description : TCM 14  Admitted to the hospital and discharged 07/15/2018 He was admitted on DKA in the context of recent surgery for new diagnosis of tonsil cancer, poor p.o. intake, prednisone therapy. Initial symptoms were nausea, vomiting feeling in general poorly, he was hypertensive and tachycardic, CBC was 600, was treated in the standard fashion.  Electrolyte imbalances resolved.   Review of Systems Since he is home, he is doing better. Still weak but getting stronger Better p.o. tolerance. Still having trouble sleeping mostly due to left shoulder pain that was present even before recent cancer surgery. At this point he does not require pain medication. No recent ambulatory CBGs, lost his glucometer. Denies nausea, vomiting, diarrhea.  Past Medical History:  Diagnosis Date  . Allergy    seasonal  . Cancer ([place])    tonsil, 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  . Diabetes mellitus 1998   type II  . Hyperlipidemia     Past Surgical History:  Procedure Laterality Date  . CHOLECYSTECTOMY  1998  . SHOULDER ARTHROSCOPY Right 2010    Dr Onnie Graham     Social History   Socioeconomic History  . Marital status: Married    Spouse name: Not on file  . Number of children: 1  . Years of education: Not on file  . Highest education level: Not on file  Occupational History  . Occupation: Medical sales representative    Employer: [employer]  Social Needs  . Financial resource strain: Not on file  . Food insecurity:    Worry: Not on file    Inability: Not on file  . Transportation needs:    Medical: No    Non-medical: No  Tobacco Use  . Smoking status: Never Smoker  . Smokeless tobacco: Never Used  Substance and Sexual Activity  . Alcohol use: Yes    Comment: rarely  . Drug use: No  . Sexual activity: Yes  Lifestyle  . Physical activity:    Days  per week: Not on file    Minutes per session: Not on file  . Stress: Not on file  Relationships  . Social connections:    Talks on phone: Not on file    Gets together: Not on file    Attends religious service: Not on file    Active member of club or organization: Not on file    Attends meetings of clubs or organizations: Not on file    Relationship status: Not on file  . Intimate partner violence:    Fear of current or ex partner: No    Emotionally abused: No    Physically abused: No    Forced sexual activity: No  Other Topics Concern  . Not on file  Social History Narrative   Daughter 2010      Allergies as of 07/25/2018      Reactions   Dust Mite Extract Hives   Takes Xyzal   Ibuprofen Hives      Medication List        Accurate as of 07/25/18 11:59 PM. Always use your most recent med list.          atorvastatin 20 MG tablet Commonly known as:  LIPITOR Take 1 tablet (20 mg total) by mouth daily.   CENTRUM PO Take 1 tablet by mouth daily.   glucose blood  test strip Check blood sugar 4 times daily   levocetirizine 5 MG tablet Commonly known as:  XYZAL Take 1 tablet (5 mg total) by mouth daily.   ONE TOUCH LANCETS Misc Check blood sugars 4 times daily   ONETOUCH VERIO w/Device Kit Check blood sugar 4 time daily   oxyCODONE 5 MG immediate release tablet Commonly known as:  Oxy IR/ROXICODONE Take 2.5 mg by mouth every 6 (six) hours as needed for pain.   senna-docusate 8.6-50 MG tablet Commonly known as:  Senokot-S Take 1 tablet by mouth at bedtime for 14 days.   sodium fluoride 1.1 % Crea dental cream Commonly known as:  PREVIDENT 5000 PLUS Apply to tooth brush. Brush teeth for 2 minutes. Spit out excess. DO NOT rinse afterwards. Repeat nightly.   XIGDUO XR 12-998 MG Tb24 Generic drug:  Dapagliflozin-metFORMIN HCl ER Take 2 tablets by mouth daily with breakfast.           Objective:   Physical Exam BP 124/64 (BP Location: Left Arm, Patient  Position: Sitting, Cuff Size: Small)   Pulse 69   Temp 97.9 F (36.6 C) (Oral)   Resp 16   Ht '5\' 9"'$  (1.753 m)   Wt 207 lb 6 oz (94.1 kg)   SpO2 96%   BMI 30.62 kg/m  General:   Well developed, NAD, BMI noted. HEENT:  Normocephalic . Face symmetric, atraumatic Well-healed surgical scar at the side of the neck, throat: + scar from recent surgery, no difficulty swallowing or breathing. Lungs:  CTA B Normal respiratory effort, no intercostal retractions, no accessory muscle use. Heart: RRR,  no murmur.  No pretibial edema bilaterally  Skin: Not pale. Not jaundice Neurologic:  alert & oriented X3.  Speech normal, gait appropriate for age and unassisted Psych--  Cognition and judgment appear intact.  Cooperative with normal attention span and concentration.  Behavior appropriate. No anxious or depressed appearing.      Assessment & Plan:    Assessment Diabetes- saw Dr Buddy Duty from 2013 to 02-2016, back to endo 9/ 2019  Hyperlipidemia Seasonal allergies  Plan: DM, DKA: Recently admitted to the hospital with DKA in the context of poor p.o. intake, dehydration, recent steroid therapy and surgery.  CBG was up to 600, he is now feeling better. Previously on [place], now only on Xigduo XR. He lost his glucometer, a prescription for a new one sent, CBG goals provided, to check CBGs once or twice a day at least. Check a CMP, CBC, follow-up with Endo as recommended. Tonsil cancer: Status post surgery, now recuperating well, he saw his surgeon last week and was told plan is close observation for the next few years without the need for radiation  therapy at this point.  That is very good news. RTC 6 months

## 2018-07-25 NOTE — Progress Notes (Signed)
Pre visit review using our clinic review tool, if applicable. No additional management support is needed unless otherwise documented below in the visit note. 

## 2018-07-26 NOTE — Assessment & Plan Note (Signed)
DM, DKA: Recently admitted to the hospital with DKA in the context of poor p.o. intake, dehydration, recent steroid therapy and surgery.  CBG was up to 600, he is now feeling better. Previously on [place], now only on Xigduo XR. He lost his glucometer, a prescription for a new one sent, CBG goals provided, to check CBGs once or twice a day at least. Check a CMP, CBC, follow-up with Endo as recommended. Tonsil cancer: Status post surgery, now recuperating well, he saw his surgeon last week and was told plan is close observation for the next few years without the need for radiation  therapy at this point.  That is very good news. RTC 6 months

## 2018-08-29 ENCOUNTER — Ambulatory Visit: Payer: 59 | Admitting: Internal Medicine

## 2018-10-07 LAB — HM DIABETES EYE EXAM

## 2018-10-25 HISTORY — PX: CERVICAL SPINE SURGERY: SHX589

## 2018-10-27 MED ORDER — CHOLECALCIFEROL 25 MCG (1000 UT) PO TABS
1000.00 | ORAL_TABLET | ORAL | Status: DC
Start: 2018-10-28 — End: 2018-10-27

## 2018-10-27 MED ORDER — BISACODYL 10 MG RE SUPP
10.00 | RECTAL | Status: DC
Start: ? — End: 2018-10-27

## 2018-10-27 MED ORDER — OXYCODONE HCL 5 MG PO TABS
5.00 | ORAL_TABLET | ORAL | Status: DC
Start: ? — End: 2018-10-27

## 2018-10-27 MED ORDER — SENNOSIDES-DOCUSATE SODIUM 8.6-50 MG PO TABS
2.00 | ORAL_TABLET | ORAL | Status: DC
Start: 2018-10-27 — End: 2018-10-27

## 2018-10-27 MED ORDER — ATORVASTATIN CALCIUM 10 MG PO TABS
20.00 | ORAL_TABLET | ORAL | Status: DC
Start: 2018-10-28 — End: 2018-10-27

## 2018-10-27 MED ORDER — ENEMA 7-19 GM/118ML RE ENEM
1.00 | ENEMA | RECTAL | Status: DC
Start: ? — End: 2018-10-27

## 2018-10-27 MED ORDER — POLYSACCHARIDE IRON COMPLEX 150 MG PO CAPS
150.00 | ORAL_CAPSULE | ORAL | Status: DC
Start: 2018-10-28 — End: 2018-10-27

## 2018-10-27 MED ORDER — ALUMINUM-MAGNESIUM-SIMETHICONE 200-200-20 MG/5ML PO SUSP
30.00 | ORAL | Status: DC
Start: ? — End: 2018-10-27

## 2018-10-27 MED ORDER — INSULIN LISPRO 100 UNIT/ML ~~LOC~~ SOLN
2.00 | SUBCUTANEOUS | Status: DC
Start: 2018-10-27 — End: 2018-10-27

## 2018-10-27 MED ORDER — DEXTROSE 10 % IV SOLN
125.00 | INTRAVENOUS | Status: DC
Start: ? — End: 2018-10-27

## 2018-10-27 MED ORDER — ACETAMINOPHEN 500 MG PO TABS
1000.00 | ORAL_TABLET | ORAL | Status: DC
Start: 2018-10-27 — End: 2018-10-27

## 2018-10-27 MED ORDER — GENERIC EXTERNAL MEDICATION
500.00 | Status: DC
Start: 2018-10-27 — End: 2018-10-27

## 2018-10-27 MED ORDER — GABAPENTIN 300 MG PO CAPS
300.00 | ORAL_CAPSULE | ORAL | Status: DC
Start: 2018-10-27 — End: 2018-10-27

## 2018-10-27 MED ORDER — CALCIUM CITRATE 200 MG PO TABS
950.00 | ORAL_TABLET | ORAL | Status: DC
Start: 2018-10-28 — End: 2018-10-27

## 2018-12-05 ENCOUNTER — Other Ambulatory Visit: Payer: Self-pay | Admitting: Internal Medicine

## 2018-12-20 ENCOUNTER — Other Ambulatory Visit: Payer: Self-pay | Admitting: Internal Medicine

## 2019-01-25 ENCOUNTER — Other Ambulatory Visit: Payer: Self-pay

## 2019-01-25 ENCOUNTER — Encounter: Payer: Self-pay | Admitting: Internal Medicine

## 2019-01-25 ENCOUNTER — Ambulatory Visit: Payer: 59 | Admitting: Internal Medicine

## 2019-01-25 ENCOUNTER — Ambulatory Visit (INDEPENDENT_AMBULATORY_CARE_PROVIDER_SITE_OTHER): Payer: 59 | Admitting: Internal Medicine

## 2019-01-25 DIAGNOSIS — E119 Type 2 diabetes mellitus without complications: Secondary | ICD-10-CM

## 2019-01-25 DIAGNOSIS — E7800 Pure hypercholesterolemia, unspecified: Secondary | ICD-10-CM

## 2019-01-25 DIAGNOSIS — C099 Malignant neoplasm of tonsil, unspecified: Secondary | ICD-10-CM | POA: Diagnosis not present

## 2019-01-25 NOTE — Progress Notes (Signed)
Subjective:    Patient ID: Dustin Goodman, male    DOB: 05-11-1966, 53 y.o.   MRN: [id number]  DOS:  01/25/2019 Type of visit - description: Virtual Visit via Video Note  I connected with@ on 01/26/19 at 11:00 AM EDT by a video enabled telemedicine application and verified that I am speaking with the correct person using two identifiers.   THIS ENCOUNTER IS A VIRTUAL VISIT DUE TO COVID-19 - PATIENT WAS NOT SEEN IN THE OFFICE. PATIENT HAS CONSENTED TO VIRTUAL VISIT / TELEMEDICINE VISIT   Location of patient: home  Location of provider: office  I discussed the limitations of evaluation and management by telemedicine and the availability of in person appointments. The patient expressed understanding and agreed to proceed.  History of Present Illness: Routine office visit DM: Good compliance with medications Had extensive cervical surgery in February 2020, he is recuperating very well. Tonsil cancer: Feeling great.  Follow-up by ENT closely.    Review of Systems  Denies fever chills No chest pain no difficulty breathing No cough No anxiety or depression  COVID-19: Doing great with precaution, still working but gets plenty of protection & PPE  Past Medical History:  Diagnosis Date  . Allergy    seasonal  . 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 Cervical radiculopathy   . Diabetes mellitus 1998   type II  . Hyperlipidemia     Past Surgical History:  Procedure Laterality Date  . CERVICAL SPINE SURGERY  10/25/2018   C3 to C7   . CHOLECYSTECTOMY  1998  . SHOULDER ARTHROSCOPY Right 2010    Dr Onnie Graham     Social History   Socioeconomic History  . Marital status: Married    Spouse name: Not on file  . Number of children: 1  . Years of education: Not on file  . Highest education level: Not on file  Occupational History  . Occupation: Medical sales representative    Employer: [employer]  Social Needs  . Financial resource strain: Not on file  . Food  insecurity:    Worry: Not on file    Inability: Not on file  . Transportation needs:    Medical: No    Non-medical: No  Tobacco Use  . Smoking status: Never Smoker  . Smokeless tobacco: Never Used  Substance and Sexual Activity  . Alcohol use: Yes    Comment: rarely  . Drug use: No  . Sexual activity: Yes  Lifestyle  . Physical activity:    Days per week: Not on file    Minutes per session: Not on file  . Stress: Not on file  Relationships  . Social connections:    Talks on phone: Not on file    Gets together: Not on file    Attends religious service: Not on file    Active member of club or organization: Not on file    Attends meetings of clubs or organizations: Not on file    Relationship status: Not on file  . Intimate partner violence:    Fear of current or ex partner: No    Emotionally abused: No    Physically abused: No    Forced sexual activity: No  Other Topics Concern  . Not on file  Social History Narrative   Daughter 2010      Allergies as of 01/25/2019      Reactions   Dust Mite Extract Hives   Takes Xyzal   Ibuprofen  Hives      Medication List       Accurate as of Jan 25, 2019 11:59 PM. If you have any questions, ask your nurse or doctor.        STOP taking these medications   oxyCODONE 5 MG immediate release tablet Commonly known as:  Oxy IR/ROXICODONE Stopped by:  Kathlene November, MD     TAKE these medications   atorvastatin 20 MG tablet Commonly known as:  LIPITOR Take 1 tablet (20 mg total) by mouth daily.   CENTRUM PO Take 1 tablet by mouth daily.   glucose blood test strip Commonly known as:  OneTouch Verio Check blood sugar 4 times daily   levocetirizine 5 MG tablet Commonly known as:  XYZAL Take 1 tablet (5 mg total) by mouth daily.   ONE TOUCH LANCETS Misc Check blood sugars 4 times daily   OneTouch Verio w/Device Kit Check blood sugar 4 time daily   sodium fluoride 1.1 % Crea dental cream Commonly known as:  PreviDent  5000 Plus Apply to tooth brush. Brush teeth for 2 minutes. Spit out excess. DO NOT rinse afterwards. Repeat nightly.   Xigduo XR 12-998 MG Tb24 Generic drug:  Dapagliflozin-metFORMIN HCl ER Take 2 tablets by mouth daily with breakfast.           Objective:   Physical Exam There were no vitals taken for this visit. This is a virtual video visit.  Alert oriented x3, no apparent distress    Assessment     Assessment Diabetes- saw Dr Buddy Duty from 2013 to 02-2016, back to endo 9/ 2019  Hyperlipidemia Seasonal allergies  Plan: DM: Follow-up by Endo, he had to be temporarily on insulin after he had cervical spine surgery.  He is back on oral medicines, will see Endo in July. Hyperlipidemia: Good med compliance, control it. Tonsil cancer: Reviewed chart Doing great, no XRT or chemotherapy were needed. Cervical stenosis:  , status post extensive C-spine surgery 10/25/2018.  Doing well Preventive care: Recommend a flu shot this season RTC 6 months CPX     I discussed the assessment and treatment plan with the patient. The patient was provided an opportunity to ask questions and all were answered. The patient agreed with the plan and demonstrated an understanding of the instructions.   The patient was advised to call back or seek an in-person evaluation if the symptoms worsen or if the condition fails to improve as anticipated.

## 2019-01-26 NOTE — Assessment & Plan Note (Signed)
DM: Follow-up by Endo, he had to be temporarily on insulin after he had cervical spine surgery.  He is back on oral medicines, will see Endo in July. Hyperlipidemia: Good med compliance, control it. Tonsil cancer: Reviewed chart Doing great, no XRT or chemotherapy were needed. Cervical stenosis:  , status post extensive C-spine surgery 10/25/2018.  Doing well Preventive care: Recommend a flu shot this season RTC 6 months CPX

## 2019-03-30 LAB — BASIC METABOLIC PANEL
BUN: 14 (ref 4–21)
Creatinine: 0.6 (ref 0.6–1.3)
Glucose: 165
Potassium: 4.1 (ref 3.4–5.3)
Sodium: 141 (ref 137–147)

## 2019-03-30 LAB — LIPID PANEL
Cholesterol: 102 (ref 0–200)
HDL: 42 (ref 35–70)
LDL Cholesterol: 38
LDl/HDL Ratio: 2.4
Triglycerides: 109 (ref 40–160)

## 2019-03-30 LAB — VITAMIN B12: Vitamin B-12: 588

## 2019-03-30 LAB — MICROALBUMIN, URINE: Microalb, Ur: <0.7

## 2019-03-30 LAB — HEPATIC FUNCTION PANEL
ALT: 15 (ref 10–40)
AST: 15 (ref 14–40)
Alkaline Phosphatase: 70 (ref 25–125)
Bilirubin, Total: 0.9

## 2019-03-30 LAB — HEMOGLOBIN A1C: Hemoglobin A1C: 8.1

## 2019-03-30 LAB — HM DIABETES FOOT EXAM

## 2019-04-11 ENCOUNTER — Encounter: Payer: Self-pay | Admitting: Internal Medicine

## 2019-06-30 ENCOUNTER — Other Ambulatory Visit: Payer: Self-pay | Admitting: Internal Medicine

## 2019-07-11 LAB — HEMOGLOBIN A1C: Hemoglobin A1C: 7.2

## 2019-07-11 LAB — HM DIABETES FOOT EXAM: HM Diabetic Foot Exam: NORMAL

## 2019-07-25 ENCOUNTER — Encounter: Payer: Self-pay | Admitting: Internal Medicine

## 2019-07-25 ENCOUNTER — Other Ambulatory Visit: Payer: Self-pay

## 2019-07-25 ENCOUNTER — Ambulatory Visit (INDEPENDENT_AMBULATORY_CARE_PROVIDER_SITE_OTHER): Payer: 59 | Admitting: Internal Medicine

## 2019-07-25 DIAGNOSIS — E7800 Pure hypercholesterolemia, unspecified: Secondary | ICD-10-CM | POA: Diagnosis not present

## 2019-07-25 DIAGNOSIS — Z0000 Encounter for general adult medical examination without abnormal findings: Secondary | ICD-10-CM | POA: Diagnosis not present

## 2019-07-25 NOTE — Assessment & Plan Note (Addendum)
-  Td 4-11   - Pnm 23: 10-2014 -  Prevnar 2017 - shingrix: We will discuss at the next opportunity - flu shot: In few days -CCS: 3 modalities discussed, he is thinking about a colonoscopy but for now we will proceed w/ an IFOB -Prostate cancer screening: Discussed patient rationale for screening, unable to do a DRE today, no symptoms, check a PSA -Lifestyle: Room for improvement on diet and exercise, counseled. -Labs: CMP, CBC, TSH, PSA

## 2019-07-25 NOTE — Progress Notes (Signed)
 Subjective:    Patient ID: Dustin Goodman, male    DOB: 11/18/1965, 53 y.o.   MRN: [id number]  DOS:  07/25/2019 Type of visit - description: Virtual Visit via Video Note  I connected with@   by a video enabled telemedicine application and verified that I am speaking with the correct person using two identifiers.   THIS ENCOUNTER IS A VIRTUAL VISIT DUE TO COVID-19 - PATIENT WAS NOT SEEN IN THE OFFICE. PATIENT HAS CONSENTED TO VIRTUAL VISIT / TELEMEDICINE VISIT   Location of patient: home  Location of provider: office  I discussed the limitations of evaluation and management by telemedicine and the availability of in person appointments. The patient expressed understanding and agreed to proceed.  History of Present Illness: CPX Feeling great.  No major concerns.    Review of Systems  A 14 point review of systems is negative   Past Medical History:  Diagnosis Date  . Allergy    seasonal  . 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 Cervical radiculopathy   . Diabetes mellitus 1998   type II  . Hyperlipidemia     Past Surgical History:  Procedure Laterality Date  . CERVICAL SPINE SURGERY  10/25/2018   C3 to C7   . CHOLECYSTECTOMY  1998  . NECK DISSECTION Left 07/2018   TORS and left neck dissection for tonsillar cancer  . SHOULDER ARTHROSCOPY Right 2010    Dr Supple    Family History  Problem Relation Age of Onset  . Cancer Maternal Aunt        breast  . Diabetes Maternal Uncle   . Diabetes Maternal Grandmother   . Breast cancer Mother   . Colon cancer Neg Hx   . Prostate cancer Neg Hx   . CAD Neg Hx     Social History   Socioeconomic History  . Marital status: Married    Spouse name: Not on file  . Number of children: 1  . Years of education: Not on file  . Highest education level: Not on file  Occupational History  . Occupation: [employer]    Employer: [employer]  Social Needs  . Financial resource strain: Not on  file  . Food insecurity    Worry: Not on file    Inability: Not on file  . Transportation needs    Medical: No    Non-medical: No  Tobacco Use  . Smoking status: Never Smoker  . Smokeless tobacco: Never Used  Substance and Sexual Activity  . Alcohol use: Yes    Comment: rarely  . Drug use: No  . Sexual activity: Yes  Lifestyle  . Physical activity    Days per week: Not on file    Minutes per session: Not on file  . Stress: Not on file  Relationships  . Social connections    Talks on phone: Not on file    Gets together: Not on file    Attends religious service: Not on file    Active member of club or organization: Not on file    Attends meetings of clubs or organizations: Not on file    Relationship status: Not on file  . Intimate partner violence    Fear of current or ex partner: No    Emotionally abused: No    Physically abused: No    Forced sexual activity: No  Other Topics Concern  . Not on file  Social History Narrative     Daughter 2010      Allergies as of 07/25/2019      Reactions   Dust Mite Extract Hives   Takes Xyzal   Ibuprofen Hives      Medication List       Accurate as of July 25, 2019 11:59 PM. If you have any questions, ask your nurse or doctor.        atorvastatin 20 MG tablet Commonly known as: LIPITOR Take 1 tablet (20 mg total) by mouth daily.   CENTRUM PO Take 1 tablet by mouth daily.   glucose blood test strip Commonly known as: OneTouch Verio Check blood sugar 4 times daily   levocetirizine 5 MG tablet Commonly known as: XYZAL Take 1 tablet (5 mg total) by mouth daily.   ONE TOUCH LANCETS Misc Check blood sugars 4 times daily   OneTouch Verio w/Device Kit Check blood sugar 4 time daily   sodium fluoride 1.1 % Crea dental cream Commonly known as: PreviDent 5000 Plus Apply to tooth brush. Brush teeth for 2 minutes. Spit out excess. DO NOT rinse afterwards. Repeat nightly.   Trulicity 1.5 NL/9.7QB Sopn Generic drug:  Dulaglutide Inject into the skin.   Xigduo XR 12-998 MG Tb24 Generic drug: Dapagliflozin-metFORMIN HCl ER Take 2 tablets by mouth daily with breakfast.           Objective:   Physical Exam There were no vitals taken for this visit. This is a virtual video visit, he is alert oriented x3, in no apparent distress.  He seems to be in great spirits    Assessment      Assessment Diabetes- saw Dr Buddy Duty from 2013 to 02-2016, back to endo 9/ 2019  Hyperlipidemia Seasonal allergies Tonsil cancer, s/p tors and left neck dissection 07-2018  Plan: Here for CPX DM: Follow-up by endocrinology, the patient reports his last A1c few days ago was 7.2. h/o tonsil cancer, recently saw him on ENT, doing great, next follow-up in approximately 4 months Hyperlipidemia: Last FLP great.  Continue Lipitor RTC for labs in few days RTC CPX 1 year   I discussed the assessment and treatment plan with the patient. The patient was provided an opportunity to ask questions and all were answered. The patient agreed with the plan and demonstrated an understanding of the instructions.   The patient was advised to call back or seek an in-person evaluation if the symptoms worsen or if the condition fails to improve as anticipated.

## 2019-07-26 ENCOUNTER — Telehealth: Payer: Self-pay | Admitting: Internal Medicine

## 2019-07-26 NOTE — Telephone Encounter (Signed)
Called patient left message for patient  To call for  Lab & nurse at his convience on the same day

## 2019-07-26 NOTE — Telephone Encounter (Signed)
-----   Message from Colon Branch, MD sent at 07/25/2019  3:44 PM EST ----- Regarding: Nurse visit [facility] nurse visit within the next few days  Dustin Goodman  Needs nonfasting labs CMP, CBC, TSH, PSA, DX CPX Needs to get an IFOB Needs a flu shot that day

## 2019-07-27 NOTE — Assessment & Plan Note (Signed)
Here for CPX DM: Follow-up by endocrinology, the patient reports his last A1c few days ago was 7.2. h/o tonsil cancer, recently saw him on ENT, doing great, next follow-up in approximately 4 months Hyperlipidemia: Last FLP great.  Continue Lipitor RTC for labs in few days RTC CPX 1 year

## 2019-07-31 ENCOUNTER — Encounter: Payer: Self-pay | Admitting: Internal Medicine

## 2019-08-02 ENCOUNTER — Other Ambulatory Visit: Payer: Self-pay

## 2019-08-02 ENCOUNTER — Other Ambulatory Visit (INDEPENDENT_AMBULATORY_CARE_PROVIDER_SITE_OTHER): Payer: 59

## 2019-08-02 DIAGNOSIS — Z0000 Encounter for general adult medical examination without abnormal findings: Secondary | ICD-10-CM

## 2019-08-02 LAB — CBC WITH DIFFERENTIAL/PLATELET
Basophils Absolute: 0 10*3/uL (ref 0.0–0.1)
Basophils Relative: 0.5 % (ref 0.0–3.0)
Eosinophils Absolute: 0.1 10*3/uL (ref 0.0–0.7)
Eosinophils Relative: 1.5 % (ref 0.0–5.0)
HCT: 44.8 % (ref 39.0–52.0)
Hemoglobin: 15.3 g/dL (ref 13.0–17.0)
Lymphocytes Relative: 27.6 % (ref 12.0–46.0)
Lymphs Abs: 1.9 10*3/uL (ref 0.7–4.0)
MCHC: 34.1 g/dL (ref 30.0–36.0)
MCV: 90.1 fl (ref 78.0–100.0)
Monocytes Absolute: 0.7 10*3/uL (ref 0.1–1.0)
Monocytes Relative: 10.3 % (ref 3.0–12.0)
Neutro Abs: 4.3 10*3/uL (ref 1.4–7.7)
Neutrophils Relative %: 60.1 % (ref 43.0–77.0)
Platelets: 237 10*3/uL (ref 150.0–400.0)
RBC: 4.97 Mil/uL (ref 4.22–5.81)
RDW: 13.1 % (ref 11.5–15.5)
WBC: 7.1 10*3/uL (ref 4.0–10.5)

## 2019-08-02 LAB — PSA: PSA: 0.49 ng/mL (ref 0.10–4.00)

## 2019-08-02 LAB — COMPREHENSIVE METABOLIC PANEL
ALT: 18 U/L (ref 0–53)
AST: 14 U/L (ref 0–37)
Albumin: 4.4 g/dL (ref 3.5–5.2)
Alkaline Phosphatase: 84 U/L (ref 39–117)
BUN: 13 mg/dL (ref 6–23)
CO2: 28 mEq/L (ref 19–32)
Calcium: 9.1 mg/dL (ref 8.4–10.5)
Chloride: 103 mEq/L (ref 96–112)
Creatinine, Ser: 0.56 mg/dL (ref 0.40–1.50)
GFR: 153.7 mL/min (ref >60.00)
Glucose, Bld: 159 mg/dL — ABNORMAL HIGH (ref 70–99)
Potassium: 4.2 mEq/L (ref 3.5–5.1)
Sodium: 140 mEq/L (ref 135–145)
Total Bilirubin: 1.2 mg/dL (ref 0.2–1.2)
Total Protein: 6.6 g/dL (ref 6.0–8.3)

## 2019-08-02 LAB — TSH: TSH: 0.76 u[IU]/mL (ref 0.35–4.50)

## 2019-09-29 DIAGNOSIS — E119 Type 2 diabetes mellitus without complications: Secondary | ICD-10-CM | POA: Diagnosis not present

## 2019-09-29 DIAGNOSIS — Z7189 Other specified counseling: Secondary | ICD-10-CM | POA: Diagnosis not present

## 2019-09-29 DIAGNOSIS — Z5181 Encounter for therapeutic drug level monitoring: Secondary | ICD-10-CM | POA: Diagnosis not present

## 2019-10-06 DIAGNOSIS — R1310 Dysphagia, unspecified: Secondary | ICD-10-CM | POA: Insufficient documentation

## 2019-10-06 DIAGNOSIS — Z9889 Other specified postprocedural states: Secondary | ICD-10-CM | POA: Diagnosis not present

## 2019-10-06 DIAGNOSIS — M5031 Other cervical disc degeneration,  high cervical region: Secondary | ICD-10-CM | POA: Diagnosis not present

## 2019-10-09 DIAGNOSIS — E119 Type 2 diabetes mellitus without complications: Secondary | ICD-10-CM | POA: Diagnosis not present

## 2019-10-09 DIAGNOSIS — H524 Presbyopia: Secondary | ICD-10-CM | POA: Diagnosis not present

## 2019-10-09 DIAGNOSIS — H5203 Hypermetropia, bilateral: Secondary | ICD-10-CM | POA: Diagnosis not present

## 2019-10-09 DIAGNOSIS — H52203 Unspecified astigmatism, bilateral: Secondary | ICD-10-CM | POA: Diagnosis not present

## 2019-10-09 LAB — HM DIABETES EYE EXAM

## 2019-10-27 DIAGNOSIS — E119 Type 2 diabetes mellitus without complications: Secondary | ICD-10-CM | POA: Diagnosis not present

## 2019-11-14 DIAGNOSIS — C099 Malignant neoplasm of tonsil, unspecified: Secondary | ICD-10-CM | POA: Diagnosis not present

## 2020-01-11 ENCOUNTER — Other Ambulatory Visit: Payer: Self-pay | Admitting: Internal Medicine

## 2020-03-26 DIAGNOSIS — C099 Malignant neoplasm of tonsil, unspecified: Secondary | ICD-10-CM | POA: Diagnosis not present

## 2020-07-08 DIAGNOSIS — Z7984 Long term (current) use of oral hypoglycemic drugs: Secondary | ICD-10-CM | POA: Diagnosis not present

## 2020-07-08 DIAGNOSIS — Z7189 Other specified counseling: Secondary | ICD-10-CM | POA: Diagnosis not present

## 2020-07-08 DIAGNOSIS — E119 Type 2 diabetes mellitus without complications: Secondary | ICD-10-CM | POA: Diagnosis not present

## 2020-07-08 LAB — COMPREHENSIVE METABOLIC PANEL
Albumin: 4.6 (ref 3.5–5.0)
Calcium: 9.8 (ref 8.7–10.7)
GFR calc Af Amer: 153
GFR calc non Af Amer: 127

## 2020-07-08 LAB — BASIC METABOLIC PANEL
BUN: 10 (ref 4–21)
CO2: 29 — AB (ref 13–22)
Chloride: 102 (ref 99–108)
Creatinine: 0.7 (ref 0.6–1.3)
Glucose: 130
Potassium: 4.5 (ref 3.4–5.3)
Sodium: 139 (ref 137–147)

## 2020-07-08 LAB — LIPID PANEL
Cholesterol: 126 (ref 0–200)
HDL: 44 (ref 35–70)
LDL Cholesterol: 64
Triglycerides: 98 (ref 40–160)

## 2020-07-08 LAB — HEPATIC FUNCTION PANEL
ALT: 25 (ref 10–40)
AST: 19 (ref 14–40)
Alkaline Phosphatase: 72 (ref 25–125)
Bilirubin, Total: 0.9

## 2020-07-08 LAB — HM DIABETES FOOT EXAM: HM Diabetic Foot Exam: NORMAL

## 2020-07-08 LAB — HEMOGLOBIN A1C: Hemoglobin A1C: 7.1

## 2020-07-08 LAB — VITAMIN B12: Vitamin B-12: 612

## 2020-07-08 LAB — MICROALBUMIN, URINE: Microalb, Ur: 0.7

## 2020-07-21 IMAGING — CT CT NECK W/ CM
4 of 5 series · 16 of 33 positions shown, 18 images · IV contrast (iopamidol)
Comparison: None.

CLINICAL DATA: Neck mass.  Diabetes hyperlipidemia.  Afebrile

EXAM:
CT NECK WITH CONTRAST
TECHNIQUE: Multidetector CT imaging of the neck was performed using the
standard protocol following the bolus administration of intravenous
contrast.
CONTRAST:  80mL MCHU5E-5BB IOPAMIDOL (MCHU5E-5BB) INJECTION 61%

[Series 3: axial neck · axial · 0.57mm/px · z∈[-354,-214]mm · 4 of 118 slices shown, 5 images]
[im 24/118  soft-tissue]
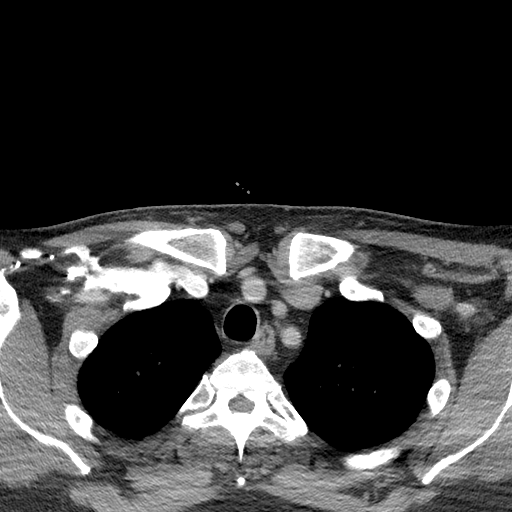
[im 24/118  bone]
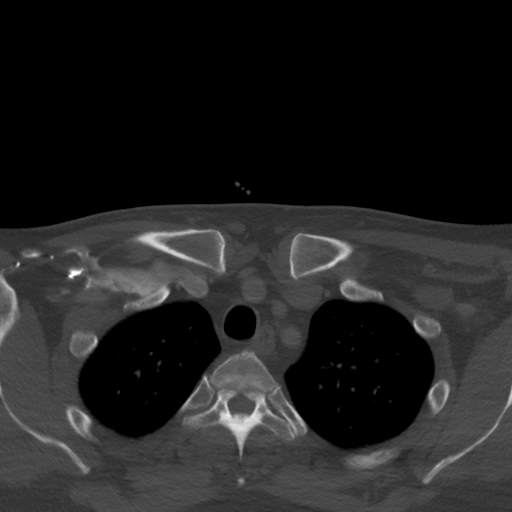
[im 47/118  bone]
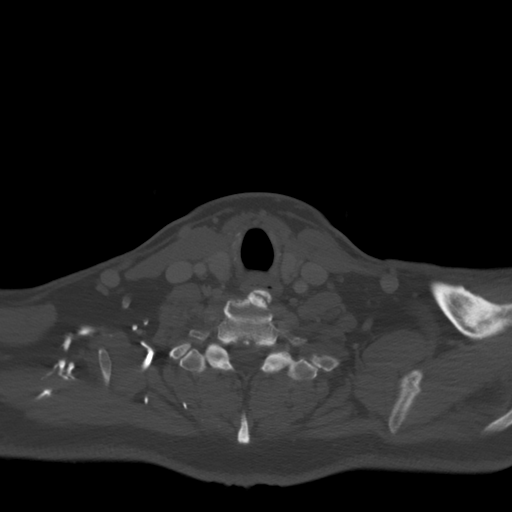
[im 71/118  bone]
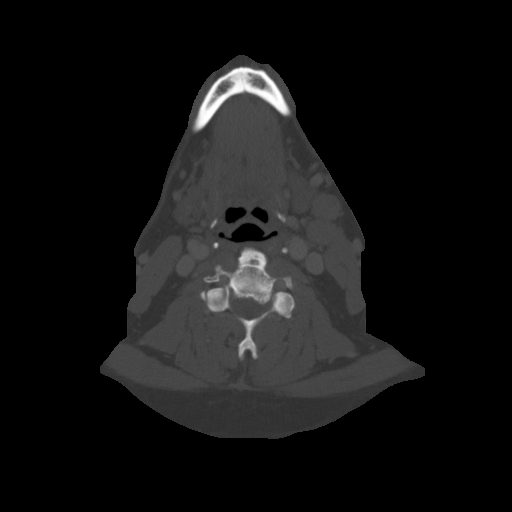
[im 94/118  bone]
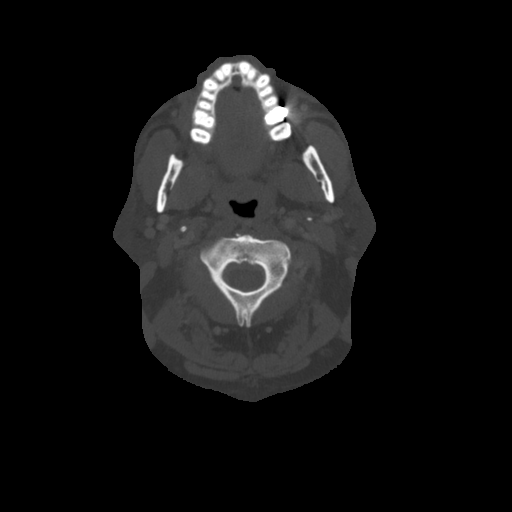

[Series 6: sag neck · sagittal · 0.47mm/px · 5 of 116 slices shown, 6 images]
[im 39/116  bone]
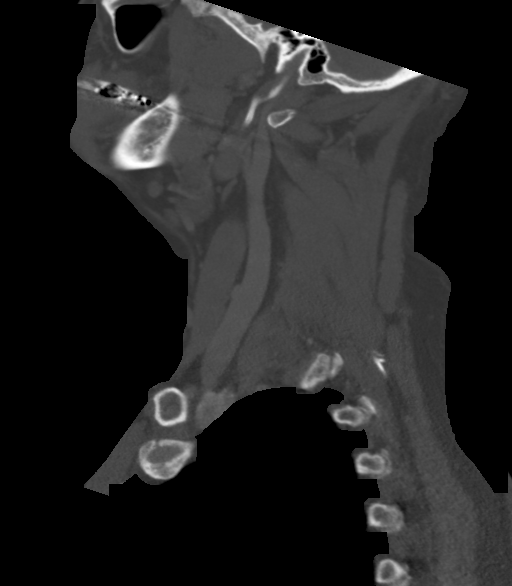
[im 48/116  bone]
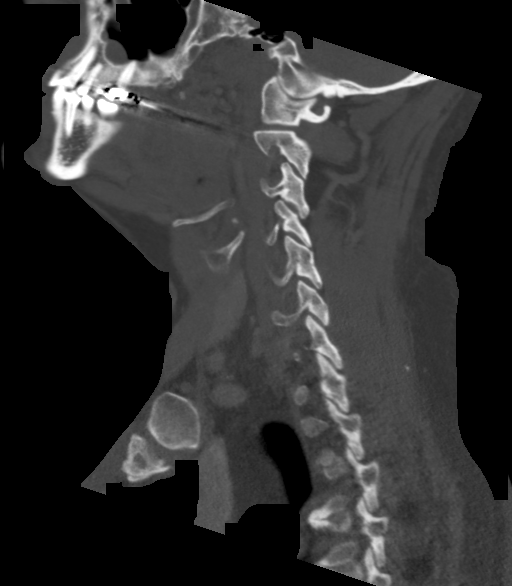
[im 58/116  soft-tissue]
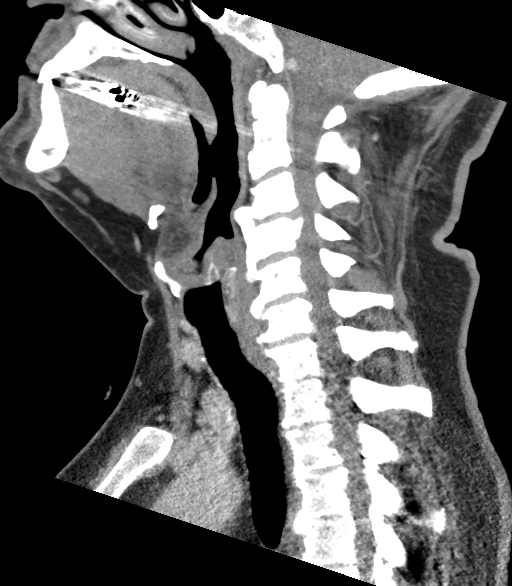
[im 58/116  bone]
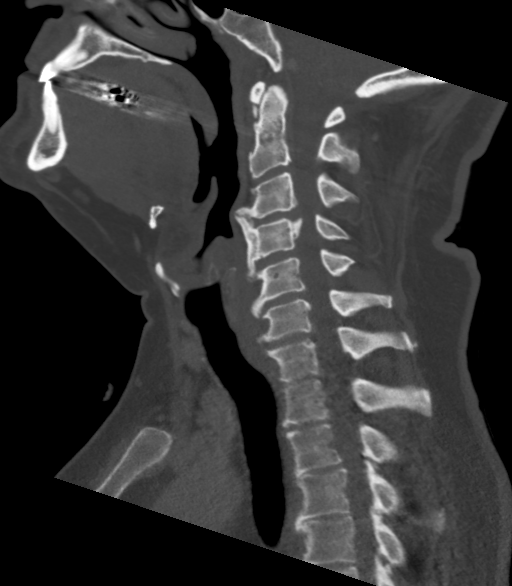
[im 68/116  bone]
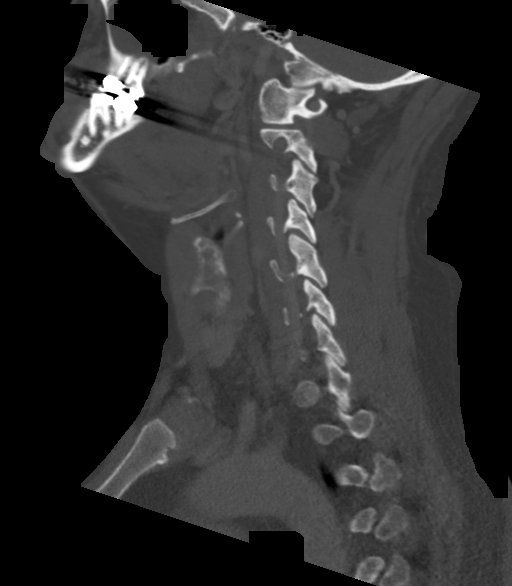
[im 77/116  bone]
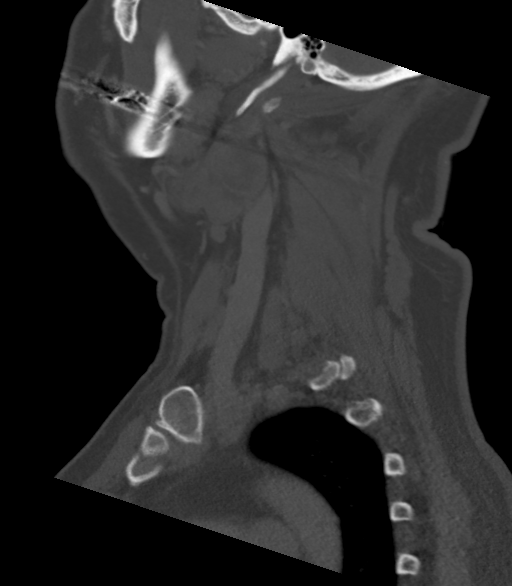

[Series 7: cor neck · coronal · 0.48mm/px · 3 of 122 slices shown]
[im 40/122  bone]
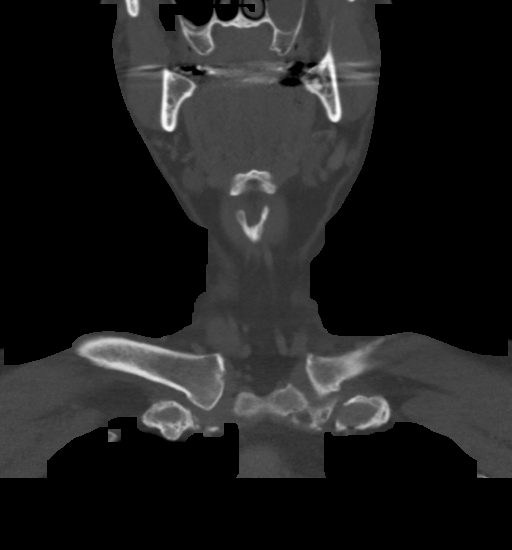
[im 54/122  bone]
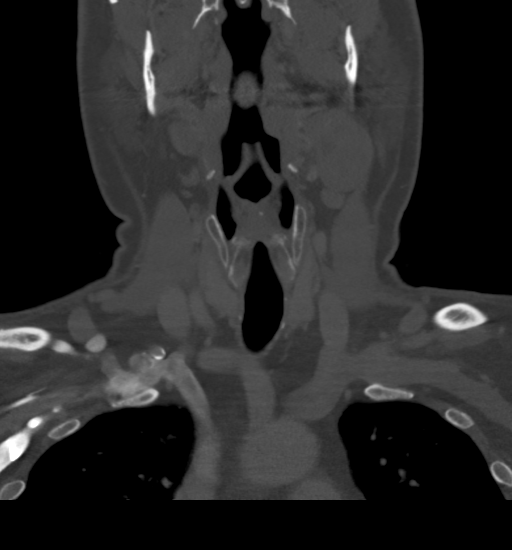
[im 68/122  bone]
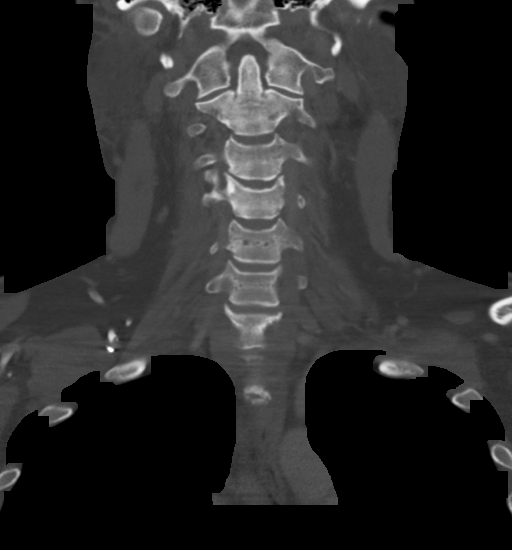

[Series 8: orthogonal ax · axial · 0.39mm/px · z∈[-398,-251]mm · 4 of 133 slices shown]
[im 27/133  bone]
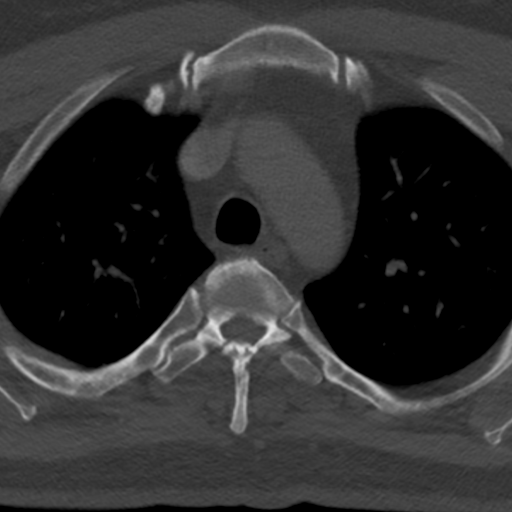
[im 53/133  bone]
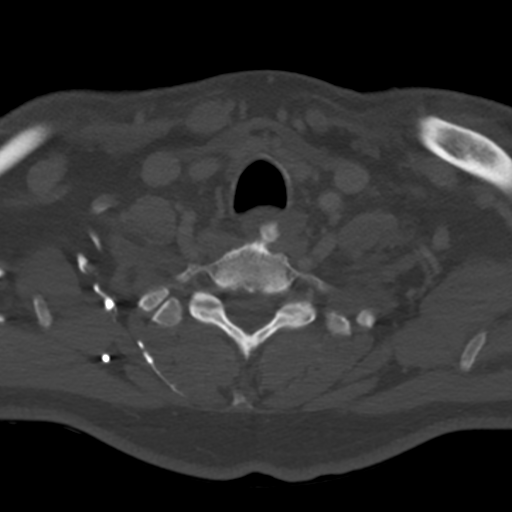
[im 80/133  bone]
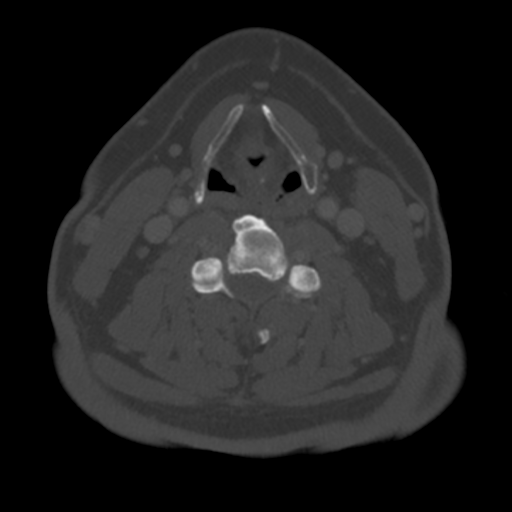
[im 106/133  bone]
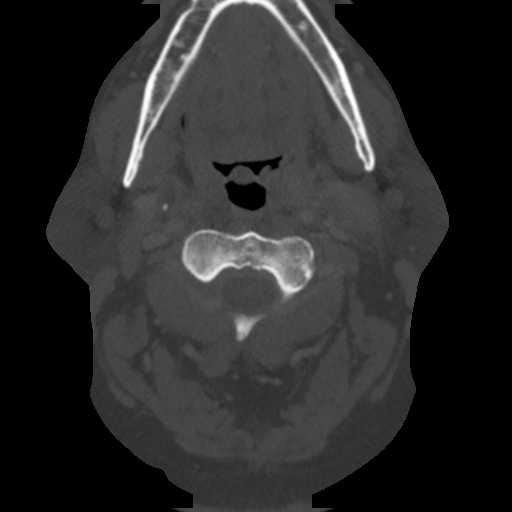

[16 of 33 positions shown; findings below may reference images not displayed]

FINDINGS: Pharynx and larynx: Mild asymmetric enlargement left tonsil with
ulceration of the mucosal surface. Possible mass lesion versus
chronic tonsillitis. Tongue base normal. Normal larynx.

Salivary glands: No inflammation, mass, or stone.

Thyroid: Normal

Lymph nodes: Left level 2 lymph node is enlarged measuring 3 x
cm. Internal cystic changes present suggesting necrosis and
malignancy. No enlarged lymph nodes in the right neck.

Vascular: Negative

Limited intracranial: Negative

Visualized orbits: Negative

Mastoids and visualized paranasal sinuses: Mucous retention cyst
left maxillary sinus. Mild mucosal edema right maxillary sinus.

Skeleton: Cervical spondylosis. Posterior spurring on the left at
C3-4. No acute skeletal abnormality.

Upper chest: Lung apices clear bilaterally.

Other: None
IMPRESSION: Enlarged left level 2 lymph node. The appearance is consistent with
malignant lymph node and biopsy recommended.

Mild enlargement left tonsil may represent tumor or chronic
inflammation. Direct visualization recommended.

## 2020-07-30 DIAGNOSIS — C099 Malignant neoplasm of tonsil, unspecified: Secondary | ICD-10-CM | POA: Diagnosis not present

## 2020-08-09 ENCOUNTER — Encounter: Payer: Self-pay | Admitting: Internal Medicine

## 2020-09-09 DIAGNOSIS — E119 Type 2 diabetes mellitus without complications: Secondary | ICD-10-CM | POA: Diagnosis not present

## 2020-09-09 DIAGNOSIS — R059 Cough, unspecified: Secondary | ICD-10-CM | POA: Diagnosis not present

## 2020-09-09 DIAGNOSIS — R0981 Nasal congestion: Secondary | ICD-10-CM | POA: Diagnosis not present

## 2020-09-09 DIAGNOSIS — Z20822 Contact with and (suspected) exposure to covid-19: Secondary | ICD-10-CM | POA: Diagnosis not present

## 2020-09-23 ENCOUNTER — Telehealth: Payer: Self-pay

## 2020-09-23 NOTE — Telephone Encounter (Signed)
Caller states, fell a week ago. Back still hurting, buttock hurting. Cannot sit comfortably. Additional Comment Caller states, needs an appt. as soon as possible.  Telephone: [phone]

## 2020-09-23 NOTE — Telephone Encounter (Signed)
Spoke to pt, appt scheduled

## 2020-09-24 ENCOUNTER — Telehealth: Payer: Self-pay | Admitting: Medical

## 2020-09-24 ENCOUNTER — Other Ambulatory Visit: Payer: Self-pay

## 2020-09-24 ENCOUNTER — Encounter: Payer: Self-pay | Admitting: Medical

## 2020-09-24 ENCOUNTER — Ambulatory Visit (HOSPITAL_BASED_OUTPATIENT_CLINIC_OR_DEPARTMENT_OTHER)
Admission: RE | Admit: 2020-09-24 | Discharge: 2020-09-24 | Disposition: A | Discharge status: Home / Self Care | Payer: BC Managed Care – PPO | Source: Ambulatory Visit | Attending: Medical | Admitting: Medical

## 2020-09-24 ENCOUNTER — Ambulatory Visit (INDEPENDENT_AMBULATORY_CARE_PROVIDER_SITE_OTHER): Payer: BC Managed Care – PPO | Admitting: Medical

## 2020-09-24 VITALS — BP 124/76 | HR 78 | Resp 18 | Ht 69.0 in | Wt 220.0 lb

## 2020-09-24 DIAGNOSIS — M533 Sacrococcygeal disorders, not elsewhere classified: Secondary | ICD-10-CM | POA: Diagnosis not present

## 2020-09-24 DIAGNOSIS — M545 Low back pain, unspecified: Secondary | ICD-10-CM

## 2020-09-24 DIAGNOSIS — S32009A Unspecified fracture of unspecified lumbar vertebra, initial encounter for closed fracture: Secondary | ICD-10-CM

## 2020-09-24 MED ORDER — TRAMADOL HCL 50 MG PO TABS: 50.0000 mg | ORAL_TABLET | Freq: Four times a day (QID) | ORAL | 0 refills | Status: AC | PRN

## 2020-09-24 NOTE — Addendum Note (Signed)
Addended by: Anabel Halon on: 09/24/2020 03:12 PM   Modules accepted: Level of Service

## 2020-09-24 NOTE — Telephone Encounter (Signed)
Please see referral and try to get in this week or next week. Thanks.

## 2020-09-24 NOTE — Progress Notes (Addendum)
Subjective:    Patient ID: Dustin Goodman, male    DOB: 10-04-1965, 55 y.o.   MRN: [id number]  HPI  Pt in for some back pain.  Pt states a week ago Friday he had a fall on jan 14 and on jan 10th.  Both fall going downs stairs in house. He was waring thermal socks and landed on steps.   Pt thought his socks is contributing factor.  Pt states he continue to have pain in lower back and describes.  Pt pain level 5-7/10.    Review of Systems  Constitutional: Negative for chills, fatigue and fever.  Respiratory: Negative for cough, chest tightness, shortness of breath and wheezing.   Cardiovascular: Negative for chest pain and palpitations.  Gastrointestinal: Negative for abdominal pain.  Genitourinary: Negative for dysuria, flank pain and frequency.  Musculoskeletal: Positive for back pain.       Coccyx and lower back pain. More pain in coccyx.  Skin: Negative for rash.  Neurological: Negative for dizziness, numbness and headaches.  Hematological: Negative for adenopathy.  Psychiatric/Behavioral: Negative for behavioral problems.     Past Medical History:  Diagnosis Date  . Allergy    seasonal  . 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 Cervical radiculopathy   . Diabetes mellitus 1998   type II  . Hyperlipidemia      Social History   Socioeconomic History  . Marital status: Married    Spouse name: Not on file  . Number of children: 1  . Years of education: Not on file  . Highest education level: Not on file  Occupational History  . Occupation: Medical sales representative    Employer: [employer]  Tobacco Use  . Smoking status: Never Smoker  . Smokeless tobacco: Never Used  Vaping Use  . Vaping Use: Never used  Substance and Sexual Activity  . Alcohol use: Yes    Comment: rarely  . Drug use: No  . Sexual activity: Yes  Other Topics Concern  . Not on file  Social History Narrative   Daughter 2010   Social Determinants of Health    Financial Resource Strain: Not on file  Food Insecurity: Not on file  Transportation Needs: Not on file  Physical Activity: Not on file  Stress: Not on file  Social Connections: Not on file  Intimate Partner Violence: Not on file    Past Surgical History:  Procedure Laterality Date  . CERVICAL SPINE SURGERY  10/25/2018   C3 to C7   . CHOLECYSTECTOMY  1998  . NECK DISSECTION Left 07/2018   TORS and left neck dissection for tonsillar cancer  . SHOULDER ARTHROSCOPY Right 2010    Dr Onnie Graham     Family History  Problem Relation Age of Onset  . Cancer Maternal Aunt        breast  . Diabetes Maternal Uncle   . Diabetes Maternal Grandmother   . Breast cancer Mother   . Colon cancer Neg Hx   . Prostate cancer Neg Hx   . CAD Neg Hx     Allergies  Allergen Reactions  . Dust Mite Extract Hives    Takes Xyzal  . Ibuprofen Hives    Current Outpatient Medications on File Prior to Visit  Medication Sig Dispense Refill  . atorvastatin (LIPITOR) 20 MG tablet Take 1 tablet (20 mg total) by mouth daily. 90 tablet 2  . Blood Glucose Monitoring Suppl (ONETOUCH VERIO) w/Device KIT Check blood sugar 4  time daily 1 kit 0  . Dapagliflozin-metFORMIN HCl ER 12-998 MG TB24 Take 2 tablets by mouth daily with breakfast.    . Dulaglutide (TRULICITY) 1.5 GY/1.7LW SOPN Inject into the skin.    Marland Kitchen glucose blood (ONETOUCH VERIO) test strip Check blood sugar 4 times daily 400 each 12  . levocetirizine (XYZAL) 5 MG tablet Take 1 tablet (5 mg total) by mouth daily. 90 tablet 3  . Multiple Vitamins-Minerals (CENTRUM PO) Take 1 tablet by mouth daily.    . ONE TOUCH LANCETS MISC Check blood sugars 4 times daily 400 each 12  . sodium fluoride (PREVIDENT 5000 PLUS) 1.1 % CREA dental cream Apply to tooth brush. Brush teeth for 2 minutes. Spit out excess. DO NOT rinse afterwards. Repeat nightly. 1 Tube prn   No current facility-administered medications on file prior to visit.    BP 124/76   Pulse 78    Resp 18   Ht $R'5\' 9"'Vk$  (1.753 m)   Wt 220 lb (99.8 kg)   SpO2 96%   BMI 32.49 kg/m       Objective:   Physical Exam General  Mental Status- Alert. General Appearance- Not in acute distress.   Skin General Appearance- Not in acute distress.    Chest and Lung Exam Auscultation: Breath sounds:-Normal. Clear even and unlabored. Adventitious sounds:- No Adventitious sounds.  Cardiovascular Auscultation:Rythm - Regular, rate and rythm. Heart Sounds -Normal heart sounds.  Abdomen Inspection:-Inspection Normal.  Palpation/Perucssion: Palpation and Percussion of the abdomen reveal- Non Tender, No Rebound tenderness, No rigidity(Guarding) and No Palpable abdominal masses.  Liver:-Normal.  Spleen:- Normal.   Back Faint mid lumbar spine tenderness to palpation. Pain on straight leg lift. More pain direct of sacrum and coccyx junction.   Hips- no pain on palpation over hips or range of motion.  Lower ext neurologic  L5-S1 sensation intact bilaterally. Normal patellar reflexes bilaterally. No foot drop bilaterally.     Assessment & Plan:  For lumbar and coccyx/sacrum pain after fall, continue tylenol and will make tramadol available for severe pain if needed. Considered nsaid rx but severe allergy to ibuprofen so decided against.  Will follow xrays and update you radiology report.  If xray negative and pain persists consider referral to sport med.  Follow up 7-10 days or as needed  Mackie Pai, PA-C   Time spent with patient today was 30  minutes which consisted of chart review, discussing diagnoses, work up,  Treatment referral and documentation.

## 2020-09-24 NOTE — Patient Instructions (Signed)
For lumbar and coccyx/sacrum pain after fall, continue tylenol and will make tramadol available for severe pain if needed. Considered nsaid rx but severe allergy to ibuprofen so decided against.  Will follow xrays and update you radiology report.  If xray negative and pain persists consider referral to sport med.  Follow up 7-10 days or as needed

## 2020-09-25 ENCOUNTER — Encounter: Payer: Self-pay | Admitting: Medical

## 2020-09-26 DIAGNOSIS — Z9889 Other specified postprocedural states: Secondary | ICD-10-CM | POA: Diagnosis not present

## 2020-09-26 DIAGNOSIS — S32009A Unspecified fracture of unspecified lumbar vertebra, initial encounter for closed fracture: Secondary | ICD-10-CM | POA: Diagnosis not present

## 2020-10-02 ENCOUNTER — Encounter: Payer: Self-pay | Admitting: Internal Medicine

## 2020-10-10 ENCOUNTER — Other Ambulatory Visit: Payer: Self-pay | Admitting: Internal Medicine

## 2020-11-02 ENCOUNTER — Other Ambulatory Visit: Payer: Self-pay | Admitting: Internal Medicine

## 2020-11-06 ENCOUNTER — Other Ambulatory Visit: Payer: Self-pay | Admitting: Internal Medicine

## 2020-11-11 DIAGNOSIS — H524 Presbyopia: Secondary | ICD-10-CM | POA: Diagnosis not present

## 2020-11-11 DIAGNOSIS — E119 Type 2 diabetes mellitus without complications: Secondary | ICD-10-CM | POA: Diagnosis not present

## 2020-11-11 DIAGNOSIS — H2513 Age-related nuclear cataract, bilateral: Secondary | ICD-10-CM | POA: Diagnosis not present

## 2020-11-11 DIAGNOSIS — H5203 Hypermetropia, bilateral: Secondary | ICD-10-CM | POA: Diagnosis not present

## 2020-11-11 DIAGNOSIS — H52203 Unspecified astigmatism, bilateral: Secondary | ICD-10-CM | POA: Diagnosis not present

## 2020-11-11 LAB — HM DIABETES EYE EXAM

## 2020-11-12 ENCOUNTER — Encounter: Payer: Self-pay | Admitting: Internal Medicine

## 2020-11-29 ENCOUNTER — Encounter: Payer: BC Managed Care – PPO | Admitting: Internal Medicine

## 2020-12-11 ENCOUNTER — Other Ambulatory Visit: Payer: Self-pay

## 2020-12-11 ENCOUNTER — Encounter: Payer: Self-pay | Admitting: Internal Medicine

## 2020-12-11 ENCOUNTER — Ambulatory Visit (INDEPENDENT_AMBULATORY_CARE_PROVIDER_SITE_OTHER): Payer: BC Managed Care – PPO | Admitting: Internal Medicine

## 2020-12-11 VITALS — BP 118/75 | HR 75 | Temp 97.4°F | Ht 69.0 in | Wt 217.0 lb

## 2020-12-11 DIAGNOSIS — E119 Type 2 diabetes mellitus without complications: Secondary | ICD-10-CM

## 2020-12-11 DIAGNOSIS — Z85818 Personal history of malignant neoplasm of other sites of lip, oral cavity, and pharynx: Secondary | ICD-10-CM | POA: Diagnosis not present

## 2020-12-11 DIAGNOSIS — Z125 Encounter for screening for malignant neoplasm of prostate: Secondary | ICD-10-CM

## 2020-12-11 DIAGNOSIS — Z23 Encounter for immunization: Secondary | ICD-10-CM | POA: Diagnosis not present

## 2020-12-11 DIAGNOSIS — Z1159 Encounter for screening for other viral diseases: Secondary | ICD-10-CM | POA: Diagnosis not present

## 2020-12-11 DIAGNOSIS — Z0000 Encounter for general adult medical examination without abnormal findings: Secondary | ICD-10-CM

## 2020-12-11 NOTE — Progress Notes (Signed)
Subjective:    Patient ID: Dustin Goodman, male    DOB: 17-Aug-1966, 55 y.o.   MRN: [id number]  DOS:  12/11/2020 Type of visit - description: CPX  In general feeling well. Did have a fall January 2022 have multiple fractures.  Was treated conservatively and currently has minor pain;  from time to time he does experience lower extremity numbness when he sits down for prolonged period of time. Last week had diarrhea for few days, he is better now, no blood in the stools. Admits to some stress mostly at work.  Review of Systems   Other than above, a 14 point review of systems is negative     Past Medical History:  Diagnosis Date  . Allergy    seasonal  . 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 Cervical radiculopathy   . Diabetes mellitus 1998   type II  . Hyperlipidemia     Past Surgical History:  Procedure Laterality Date  . CERVICAL SPINE SURGERY  10/25/2018   C3 to C7   . CHOLECYSTECTOMY  1998  . NECK DISSECTION Left 07/2018   TORS and left neck dissection for tonsillar cancer  . SHOULDER ARTHROSCOPY Right 2010    Dr Rennis Chris     Allergies as of 12/11/2020      Reactions   Dust Mite Extract Hives   Takes Xyzal   Ibuprofen Hives      Medication List       Accurate as of December 11, 2020  2:19 PM. If you have any questions, ask your nurse or doctor.        atorvastatin 20 MG tablet Commonly known as: LIPITOR Take 1 tablet (20 mg total) by mouth daily.   CENTRUM PO Take 1 tablet by mouth daily.   Dapagliflozin-metFORMIN HCl ER 12-998 MG Tb24 Take 2 tablets by mouth daily with breakfast.   glucose blood test strip Commonly known as: OneTouch Verio Check blood sugar 4 times daily   levocetirizine 5 MG tablet Commonly known as: XYZAL Take 1 tablet (5 mg total) by mouth daily.   ONE TOUCH LANCETS Misc Check blood sugars 4 times daily   OneTouch Verio w/Device Kit Check blood sugar 4 time daily   sodium fluoride 1.1 % Crea dental  cream Commonly known as: PreviDent 5000 Plus Apply to tooth brush. Brush teeth for 2 minutes. Spit out excess. DO NOT rinse afterwards. Repeat nightly.   Trulicity 1.5 MG/0.5ML Sopn Generic drug: Dulaglutide Inject into the skin.          Objective:   Physical Exam BP 118/75 (BP Location: Right Arm, Patient Position: Sitting, Cuff Size: Large)   Pulse 75   Temp (!) 97.4 F (36.3 C) (Temporal)   Ht 5\' 9"  (1.753 m)   Wt 217 lb (98.4 kg)   SpO2 96%   BMI 32.05 kg/m  General: Well developed, NAD, BMI noted Neck: No  thyromegaly  HEENT:  Normocephalic . Face symmetric, atraumatic Lungs:  CTA B Normal respiratory effort, no intercostal retractions, no accessory muscle use. Heart: RRR,  no murmur.  Abdomen:  Not distended, soft, non-tender. No rebound or rigidity. DRE: Normal sphincter tone, prostate normal, no stools  Lower extremities: no pretibial edema bilaterally  Skin: Exposed areas without rash. Not pale. Not jaundice Neurologic:  alert & oriented X3.  Speech normal, gait appropriate for age and unassisted Strength symmetric and appropriate for age.  Psych: Cognition and judgment appear intact.  Cooperative with normal attention span and concentration.  Behavior appropriate. No anxious or depressed appearing.     Assessment     Assessment Diabetes- saw Dr Buddy Duty from 2013 to 02-2016, back to endo 9/ 2019  Hyperlipidemia Seasonal allergies Tonsil cancer, s/p tors and left neck dissection 07-2018 Cervical spine surgery 10/2018 Fall at home 08/2020: Left L1-3 TP fractures and a non-displcaed left L4 TP fracture, treat conservatively    Plan: Here for CPX, last visit 2020 DM: Last note from endocrinology 07-2020, A1c was 7.1 Hyperlipidemia:On atorvastatin, LDL was 64 few months ago.  No change History of tonsil cancer: Sees ENT regularly Transverse process fractures: After a fall 08-2020, treated by neurosurgery conservatively.  Occasionally has lower extremity  paresthesias. RTC 1 year   This visit occurred during the SARS-CoV-2 public health emergency.  Safety protocols were in place, including screening questions prior to the visit, additional usage of staff PPE, and extensive cleaning of exam room while observing appropriate contact time as indicated for disinfecting solutions.

## 2020-12-11 NOTE — Patient Instructions (Addendum)
Please see your endocrinologist regularly  Please consider a COVID vaccine booster (your  fourth shot).  Return the stool sample at your earliest [facility] : Get the blood work     [place], [place] back for your second dose of Shingrix in 2 to 3 months.  Come back for a physical exam in 1 year    "Living will", "[place] of attorney": Advanced care planning  (If you already have a living will or healthcare power of attorney, please bring the copy to be scanned in your chart.)  Advance care planning is a process that supports adults in  understanding and sharing their preferences regarding future medical care.   The patient's preferences are recorded in documents called Advance Directives.    Advanced directives are completed (and can be modified at any time) while the patient is in full mental capacity.   The documentation should be available at all times to the patient, the family and the healthcare providers.  Bring in a copy to be scanned in your chart is an excellent idea and is recommended   This legal documents direct treatment decision making and/or appoint a surrogate to make the decision if the patient is not capable to do so.    Advance directives can be documented in many types of formats,  documents have names such as:  Lliving will  Durable power of attorney for healthcare (healthcare proxy or healthcare power of attorney)  Combined directives  Physician orders for life-sustaining treatment    More information at:  [email or web address]

## 2020-12-12 ENCOUNTER — Encounter: Payer: Self-pay | Admitting: Internal Medicine

## 2020-12-12 LAB — CBC WITH DIFFERENTIAL/PLATELET
Basophils Absolute: 0 10*3/uL (ref 0.0–0.1)
Basophils Relative: 0.6 % (ref 0.0–3.0)
Eosinophils Absolute: 0.1 10*3/uL (ref 0.0–0.7)
Eosinophils Relative: 1.3 % (ref 0.0–5.0)
HCT: 43.4 % (ref 39.0–52.0)
Hemoglobin: 15 g/dL (ref 13.0–17.0)
Lymphocytes Relative: 30 % (ref 12.0–46.0)
Lymphs Abs: 2.3 10*3/uL (ref 0.7–4.0)
MCHC: 34.7 g/dL (ref 30.0–36.0)
MCV: 88.3 fl (ref 78.0–100.0)
Monocytes Absolute: 0.7 10*3/uL (ref 0.1–1.0)
Monocytes Relative: 9.5 % (ref 3.0–12.0)
Neutro Abs: 4.5 10*3/uL (ref 1.4–7.7)
Neutrophils Relative %: 58.6 % (ref 43.0–77.0)
Platelets: 255 10*3/uL (ref 150.0–400.0)
RBC: 4.91 Mil/uL (ref 4.22–5.81)
RDW: 12.9 % (ref 11.5–15.5)
WBC: 7.7 10*3/uL (ref 4.0–10.5)

## 2020-12-12 LAB — COMPREHENSIVE METABOLIC PANEL
ALT: 26 U/L (ref 0–53)
AST: 18 U/L (ref 0–37)
Albumin: 4.2 g/dL (ref 3.5–5.2)
Alkaline Phosphatase: 75 U/L (ref 39–117)
BUN: 11 mg/dL (ref 6–23)
CO2: 28 mEq/L (ref 19–32)
Calcium: 9.2 mg/dL (ref 8.4–10.5)
Chloride: 104 mEq/L (ref 96–112)
Creatinine, Ser: 0.63 mg/dL (ref 0.40–1.50)
GFR: 109.33 mL/min (ref >60.00)
Glucose, Bld: 102 mg/dL — ABNORMAL HIGH (ref 70–99)
Potassium: 4.2 mEq/L (ref 3.5–5.1)
Sodium: 140 mEq/L (ref 135–145)
Total Bilirubin: 0.9 mg/dL (ref 0.2–1.2)
Total Protein: 6.8 g/dL (ref 6.0–8.3)

## 2020-12-12 LAB — PSA: PSA: 0.52 ng/mL (ref 0.10–4.00)

## 2020-12-12 LAB — HEPATITIS C ANTIBODY
Hepatitis C Ab: NONREACTIVE
SIGNAL TO CUT-OFF: 0.01 (ref <1.00)

## 2020-12-12 LAB — TSH: TSH: 0.74 u[IU]/mL (ref 0.35–4.50)

## 2020-12-12 NOTE — Assessment & Plan Note (Signed)
Here for CPX, last visit 2020 DM: Last note from endocrinology 07-2020, A1c was 7.1 Hyperlipidemia:On atorvastatin, LDL was 64 few months ago.  No change History of tonsil cancer: Sees ENT regularly Transverse process fractures: After a fall 08-2020, treated by neurosurgery conservatively.  Occasionally has lower extremity paresthesias. RTC 1 year

## 2020-12-12 NOTE — Assessment & Plan Note (Signed)
-  Td 4-11, booster today. - Pnm 23:10-2014; Prevnar 2017 - shingrix: Discussed, first dose today, next in 2 to 3 months -Covid VAX x3, booster discussed -CCS: 3 modalities discussed, elected FIT test for now -Prostate cancer screening: DRE normal, check a PSA.   -Lifestyle: Discussed -Labs: Labs reviewed, will check a CMP, CBC TSH, PSA, hep C  -Advance directive discussed

## 2020-12-19 ENCOUNTER — Other Ambulatory Visit: Payer: Self-pay | Admitting: Internal Medicine

## 2021-01-02 ENCOUNTER — Other Ambulatory Visit: Payer: Self-pay | Admitting: Internal Medicine

## 2021-01-16 DIAGNOSIS — E119 Type 2 diabetes mellitus without complications: Secondary | ICD-10-CM | POA: Diagnosis not present

## 2021-01-16 LAB — HEMOGLOBIN A1C: Hemoglobin A1C: 7.5

## 2021-01-16 LAB — HM DIABETES FOOT EXAM: HM Diabetic Foot Exam: NORMAL

## 2021-01-28 DIAGNOSIS — C099 Malignant neoplasm of tonsil, unspecified: Secondary | ICD-10-CM | POA: Diagnosis not present

## 2021-01-30 DIAGNOSIS — E119 Type 2 diabetes mellitus without complications: Secondary | ICD-10-CM | POA: Diagnosis not present

## 2021-01-30 DIAGNOSIS — R61 Generalized hyperhidrosis: Secondary | ICD-10-CM | POA: Diagnosis not present

## 2021-01-31 ENCOUNTER — Encounter: Payer: Self-pay | Admitting: Internal Medicine

## 2021-02-05 ENCOUNTER — Other Ambulatory Visit: Payer: Self-pay | Admitting: Internal Medicine

## 2021-02-05 DIAGNOSIS — R17 Unspecified jaundice: Secondary | ICD-10-CM

## 2021-02-05 DIAGNOSIS — R61 Generalized hyperhidrosis: Secondary | ICD-10-CM

## 2021-02-14 ENCOUNTER — Ambulatory Visit
Admission: RE | Admit: 2021-02-14 | Discharge: 2021-02-14 | Disposition: A | Discharge status: Home / Self Care | Payer: BC Managed Care – PPO | Source: Ambulatory Visit | Attending: Internal Medicine | Admitting: Internal Medicine

## 2021-02-14 DIAGNOSIS — R17 Unspecified jaundice: Secondary | ICD-10-CM

## 2021-02-14 DIAGNOSIS — R61 Generalized hyperhidrosis: Secondary | ICD-10-CM

## 2021-02-27 ENCOUNTER — Telehealth: Payer: Self-pay

## 2021-02-27 ENCOUNTER — Other Ambulatory Visit (INDEPENDENT_AMBULATORY_CARE_PROVIDER_SITE_OTHER): Payer: BC Managed Care – PPO

## 2021-02-27 DIAGNOSIS — R195 Other fecal abnormalities: Secondary | ICD-10-CM

## 2021-02-27 DIAGNOSIS — Z0000 Encounter for general adult medical examination without abnormal findings: Secondary | ICD-10-CM

## 2021-02-27 LAB — FECAL OCCULT BLOOD, IMMUNOCHEMICAL: Fecal Occult Bld: POSITIVE — AB

## 2021-02-27 NOTE — Telephone Encounter (Signed)
Mychart message sent.

## 2021-02-27 NOTE — Telephone Encounter (Signed)
Agree, thx! 

## 2021-02-27 NOTE — Telephone Encounter (Signed)
Positive Ifob  Caller: Cecille Rubin Receiver: Jermane Brayboy Date and time:  02/27/2021 at 7:55 am

## 2021-02-27 NOTE — Telephone Encounter (Signed)
FYI. Placing referral to GI.

## 2021-02-27 NOTE — Addendum Note (Signed)
Addended byDamita Dunnings D on: 02/27/2021 08:09 AM   Modules accepted: Orders

## 2021-03-11 ENCOUNTER — Encounter: Payer: Self-pay | Admitting: Gastroenterology

## 2021-03-19 ENCOUNTER — Ambulatory Visit (AMBULATORY_SURGERY_CENTER): Payer: BC Managed Care – PPO

## 2021-03-19 ENCOUNTER — Other Ambulatory Visit: Payer: Self-pay

## 2021-03-19 ENCOUNTER — Other Ambulatory Visit: Payer: Self-pay | Admitting: Gastroenterology

## 2021-03-19 DIAGNOSIS — R195 Other fecal abnormalities: Secondary | ICD-10-CM

## 2021-03-19 MED ORDER — NA SULFATE-K SULFATE-MG SULF 17.5-3.13-1.6 GM/177ML PO SOLN: 1.0000 | Freq: Once | ORAL | 0 refills | Status: AC

## 2021-03-19 NOTE — Progress Notes (Signed)
Patient's pre-visit was done today over the phone with the patient Name,DOB and address verified.    Patient denies any allergies to Eggs and Soy. Patient denies any problems with anesthesia/sedation. Patient denies taking diet pills or blood thinners. No home Oxygen. Packet of Prep instructions mailed to patient including a copy of a consent form-pt is aware. Patient understands to call us back with any questions or concerns. Patient is aware of our care-partner policy and [id number] safety protocol.

## 2021-04-03 ENCOUNTER — Telehealth: Payer: Self-pay | Admitting: Gastroenterology

## 2021-04-03 DIAGNOSIS — R195 Other fecal abnormalities: Secondary | ICD-10-CM

## 2021-04-03 MED ORDER — PLENVU 140 G PO SOLR: 1.0000 | Freq: Once | ORAL | 0 refills | Status: AC

## 2021-04-03 NOTE — Telephone Encounter (Signed)
Patient called stating that Suprep for procedure is not covered by his insurance. His procedure is tomorrow 04/04/21. Please call patient.

## 2021-04-03 NOTE — Telephone Encounter (Signed)
RX for Plenvu with coupon sent to pharmacy  New prep instructions emailed to pt per his request- name, DOB, and MRN removed.

## 2021-04-04 ENCOUNTER — Other Ambulatory Visit: Payer: Self-pay

## 2021-04-04 ENCOUNTER — Encounter: Payer: Self-pay | Admitting: Gastroenterology

## 2021-04-04 ENCOUNTER — Ambulatory Visit (AMBULATORY_SURGERY_CENTER): Payer: BC Managed Care – PPO | Admitting: Gastroenterology

## 2021-04-04 VITALS — BP 124/72 | HR 81 | Temp 98.5°F | Resp 12 | Ht 65.0 in | Wt 217.0 lb

## 2021-04-04 DIAGNOSIS — R194 Change in bowel habit: Secondary | ICD-10-CM | POA: Diagnosis not present

## 2021-04-04 DIAGNOSIS — D124 Benign neoplasm of descending colon: Secondary | ICD-10-CM

## 2021-04-04 DIAGNOSIS — K5730 Diverticulosis of large intestine without perforation or abscess without bleeding: Secondary | ICD-10-CM | POA: Diagnosis not present

## 2021-04-04 DIAGNOSIS — R195 Other fecal abnormalities: Secondary | ICD-10-CM | POA: Diagnosis not present

## 2021-04-04 DIAGNOSIS — D123 Benign neoplasm of transverse colon: Secondary | ICD-10-CM

## 2021-04-04 DIAGNOSIS — K640 First degree hemorrhoids: Secondary | ICD-10-CM

## 2021-04-04 DIAGNOSIS — D125 Benign neoplasm of sigmoid colon: Secondary | ICD-10-CM | POA: Diagnosis not present

## 2021-04-04 DIAGNOSIS — Z1211 Encounter for screening for malignant neoplasm of colon: Secondary | ICD-10-CM | POA: Diagnosis not present

## 2021-04-04 DIAGNOSIS — R197 Diarrhea, unspecified: Secondary | ICD-10-CM

## 2021-04-04 HISTORY — PX: COLONOSCOPY: SHX174

## 2021-04-04 MED ORDER — SODIUM CHLORIDE 0.9 % IV SOLN: 500.0000 mL | Freq: Once | INTRAVENOUS | Status: DC

## 2021-04-04 NOTE — Progress Notes (Signed)
Pt's states no medical or surgical changes since previsit or office visit.  ° °Vitals CW °

## 2021-04-04 NOTE — Op Note (Signed)
[facility] Patient Name: Dustin Goodman Procedure Date: 04/04/2021 10:33 AM MRN: [id number] Endoscopist: Ladene Artist , MD Age: 55 Referring MD:  Date of Birth: 12-06-1965 Gender: Male Account #: [id number] Procedure:                Colonoscopy Indications:              Heme positive stool, Change in bowel habits,                            Clinically significant diarrhea of unexplained                            origin Medicines:                Monitored Anesthesia Care Procedure:                Pre-Anesthesia Assessment:                           - Prior to the procedure, a History and Physical                            was performed, and patient medications and                            allergies were reviewed. The patient's tolerance of                            previous anesthesia was also reviewed. The risks                            and benefits of the procedure and the sedation                            options and risks were discussed with the patient.                            All questions were answered, and informed consent                            was obtained. Prior Anticoagulants: The patient has                            taken no previous anticoagulant or antiplatelet                            agents. ASA Grade Assessment: II - A patient with                            mild systemic disease. After reviewing the risks                            and benefits, the patient was deemed in  satisfactory condition to undergo the procedure.                           After obtaining informed consent, the colonoscope                            was passed under direct vision. Throughout the                            procedure, the patient's blood pressure, pulse, and                            oxygen saturations were monitored continuously. The                            Colonoscope was introduced through the anus and                             advanced to the the cecum, identified by                            appendiceal orifice and ileocecal valve. The                            ileocecal valve, appendiceal orifice, and rectum                            were photographed. The quality of the bowel                            preparation was good. The colonoscopy was performed                            without difficulty. The colon was mildly tortuous.                            The patient tolerated the procedure well. Scope In: 10:44:07 AM Scope Out: 11:11:26 AM Scope Withdrawal Time: 0 hours 19 minutes 59 seconds  Total Procedure Duration: 0 hours 27 minutes 19 seconds  Findings:                 The perianal and digital rectal examinations were                            normal.                           Four sessile (3) and semi-pedunculated polyps (10                            mm polyp in descending colon) were found in the                            sigmoid colon, descending colon (2) and transverse  colon. The polyps were 5 to 10 mm in size. These                            polyps were removed with a cold snare. Resection                            and retrieval were complete.                           Multiple medium-mouthed diverticula were found in                            the left colon. There was no evidence of                            diverticular bleeding.                           Internal hemorrhoids were found during                            retroflexion. The hemorrhoids were small and Grade                            I (internal hemorrhoids that do not prolapse).                           The exam was otherwise without abnormality on                            direct and retroflexion views. Random biopsies                            obtained throughout. Complications:            No immediate complications. Estimated blood loss:                             None. Estimated Blood Loss:     Estimated blood loss: none. Impression:               - Four 5 to 10 mm polyps in the sigmoid colon, in                            the descending colon and in the transverse colon,                            removed with a cold snare. Resected and retrieved.                           - Mild diverticulosis in the left colon.                           - Internal hemorrhoids.                           -  The examination was otherwise normal on direct                            and retroflexion views. Recommendation:           - Repeat colonoscopy after studies are complete for                            surveillance based on pathology results.                           - Patient has a contact number available for                            emergencies. The signs and symptoms of potential                            delayed complications were discussed with the                            patient. Return to normal activities tomorrow.                            Written discharge instructions were provided to the                            patient.                           - Resume previous diet.                           - Continue present medications.                           - Await pathology results.                           - No aspirin, ibuprofen, naproxen, or other                            non-steroidal anti-inflammatory drugs for 2 weeks                            after polyp removal.                           - If random biospies are unremarkale hold metformin                            for 1 week and reassess diarrhea under direction of                            his PCP. Ladene Artist, MD 04/04/2021 11:19:08 AM This report has been signed electronically.

## 2021-04-04 NOTE — Progress Notes (Signed)
To pacu, VSS. Report to [email or web address] 

## 2021-04-04 NOTE — Progress Notes (Signed)
Called to room to assist during endoscopic procedure.  Patient ID and intended procedure confirmed with present staff. Received instructions for my participation in the procedure from the performing physician.  

## 2021-04-04 NOTE — Patient Instructions (Signed)
Impression/Recommendations:  Polyp, diverticulosis, and hemorrhoid handouts given to patient.  Repeat colonoscopy for surveillance.  Date to be determined after pathology results reviewed.  Resume previous diet. Continue present medications. Await pathology results.  No aspirin, ibuprofen, naproxen, or other NSAID drugs for 2 weeks.  If biopsies are unremarkable, hold Metformin for 1 week and reassess diarrhea under direction of PCP.  YOU HAD AN ENDOSCOPIC PROCEDURE TODAY AT [facility]:   Refer to the procedure report that was given to you for any specific questions about what was found during the examination.  If the procedure report does not answer your questions, please call your gastroenterologist to clarify.  If you requested that your care partner not be given the details of your procedure findings, then the procedure report has been included in a sealed envelope for you to review at your convenience later.  YOU SHOULD EXPECT: Some feelings of bloating in the abdomen. Passage of more gas than usual.  Walking can help get rid of the air that was put into your GI tract during the procedure and reduce the bloating. If you had a lower endoscopy (such as a colonoscopy or flexible sigmoidoscopy) you may notice spotting of blood in your stool or on the toilet paper. If you underwent a bowel prep for your procedure, you may not have a normal bowel movement for a few days.  Please Note:  You might notice some irritation and congestion in your nose or some drainage.  This is from the oxygen used during your procedure.  There is no need for concern and it should clear up in a day or so.  SYMPTOMS TO REPORT IMMEDIATELY:  Following lower endoscopy (colonoscopy or flexible sigmoidoscopy):  Excessive amounts of blood in the stool  Significant tenderness or worsening of abdominal pains  Swelling of the abdomen that is new, acute  Fever of 100F or higher  For urgent or emergent  issues, a gastroenterologist can be reached at any hour by calling [phone]. Do not use MyChart messaging for urgent concerns.    DIET:  We do recommend a small meal at first, but then you may proceed to your regular diet.  Drink plenty of fluids but you should avoid alcoholic beverages for 24 hours.  ACTIVITY:  You should plan to take it easy for the rest of today and you should NOT DRIVE or use heavy machinery until tomorrow (because of the sedation medicines used during the test).    FOLLOW UP: Our staff will call the number listed on your records 48-72 hours following your procedure to check on you and address any questions or concerns that you may have regarding the information given to you following your procedure. If we do not reach you, we will leave a message.  We will attempt to reach you two times.  During this call, we will ask if you have developed any symptoms of COVID 19. If you develop any symptoms (ie: fever, flu-like symptoms, shortness of breath, cough etc.) before then, please call [phone].  If you test positive for Covid 19 in the 2 weeks post procedure, please call and report this information to [place].    If any biopsies were taken you will be contacted by phone or by letter within the next 1-3 weeks.  Please call us at [phone] if you have not heard about the biopsies in 3 weeks.    SIGNATURES/CONFIDENTIALITY: You and/or your care partner have signed paperwork which will be entered  into your electronic medical record.  These signatures attest to the fact that that the information above on your After Visit Summary has been reviewed and is understood.  Full responsibility of the confidentiality of this discharge information lies with you and/or your care-partner.

## 2021-04-08 ENCOUNTER — Telehealth: Payer: Self-pay | Admitting: *Deleted

## 2021-04-08 ENCOUNTER — Telehealth: Payer: Self-pay

## 2021-04-08 NOTE — Telephone Encounter (Deleted)
  Follow up Call-  Call back number 04/04/2021  Post procedure Call Back phone  # [phone]  Permission to leave phone message Yes  Some recent data might be hidden     Patient questions:  Do you have a fever, pain , or abdominal swelling? No. Pain Score  0 *  Have you tolerated food without any problems? Yes.    Have you been able to return to your normal activities? Yes.    Do you have any questions about your discharge instructions: Diet   No. Medications  No. Follow up visit  No.  Do you have questions or concerns about your Care? No.  Actions: * If pain score is 4 or above: No action needed, pain <4.  Have you developed a fever since your procedure? no  2.   Have you had an respiratory symptoms (SOB or cough) since your procedure? no  3.   Have you tested positive for COVID 19 since your procedure no  4.   Have you had any family members/close contacts diagnosed with the COVID 19 since your procedure?  no   If yes to any of these questions please route to Joylene John, RN and Joella Prince, RN

## 2021-04-08 NOTE — Telephone Encounter (Signed)
First post procedure follow up call, no answer 

## 2021-04-08 NOTE — Telephone Encounter (Signed)
  Follow up Call-  Call back number 04/04/2021  Post procedure Call Back phone  # [phone]  Permission to leave phone message Yes  Some recent data might be hidden     Patient questions:  Do you have a fever, pain , or abdominal swelling? No. Pain Score  0 *  Have you tolerated food without any problems? Yes.    Have you been able to return to your normal activities? Yes.    Do you have any questions about your discharge instructions: Diet   No. Medications  No. Follow up visit  No.  Do you have questions or concerns about your Care? No.  Actions: * If pain score is 4 or above: No action needed, pain <4.Have you developed a fever since your procedure? no  2.   Have you had an respiratory symptoms (SOB or cough) since your procedure? no  3.   Have you tested positive for COVID 19 since your procedure no  4.   Have you had any family members/close contacts diagnosed with the COVID 19 since your procedure?  no   If yes to any of these questions please route to Joylene John, RN and Joella Prince, RN

## 2021-04-18 ENCOUNTER — Encounter: Payer: Self-pay | Admitting: Gastroenterology

## 2021-04-21 DIAGNOSIS — E119 Type 2 diabetes mellitus without complications: Secondary | ICD-10-CM | POA: Diagnosis not present

## 2021-04-21 DIAGNOSIS — R197 Diarrhea, unspecified: Secondary | ICD-10-CM | POA: Diagnosis not present

## 2021-05-21 ENCOUNTER — Encounter: Payer: Self-pay | Admitting: Family Medicine

## 2021-05-21 ENCOUNTER — Ambulatory Visit (INDEPENDENT_AMBULATORY_CARE_PROVIDER_SITE_OTHER): Payer: BC Managed Care – PPO | Admitting: Family Medicine

## 2021-05-21 ENCOUNTER — Other Ambulatory Visit: Payer: Self-pay

## 2021-05-21 VITALS — BP 108/62 | HR 80 | Temp 98.0°F | Ht 69.0 in | Wt 217.4 lb

## 2021-05-21 DIAGNOSIS — M545 Low back pain, unspecified: Secondary | ICD-10-CM | POA: Diagnosis not present

## 2021-05-21 DIAGNOSIS — M76892 Other specified enthesopathies of left lower limb, excluding foot: Secondary | ICD-10-CM | POA: Diagnosis not present

## 2021-05-21 MED ORDER — TIZANIDINE HCL 4 MG PO TABS
4.0000 mg | ORAL_TABLET | Freq: Four times a day (QID) | ORAL | 0 refills | Status: DC | PRN
Start: 2021-05-21 — End: 2022-04-15

## 2021-05-21 MED ORDER — PREDNISONE 20 MG PO TABS: 40.0000 mg | ORAL_TABLET | Freq: Every day | ORAL | 0 refills | Status: AC

## 2021-05-21 NOTE — Patient Instructions (Addendum)
Heat (pad or rice pillow in microwave) over affected area, 10-15 minutes twice daily.   Ice/cold pack over area for 10-15 min twice daily.  OK to take Tylenol 1000 mg (2 extra strength tabs) or 975 mg (3 regular strength tabs) every 6 hours as needed.  Let us know if you need anything.  EXERCISES  RANGE OF MOTION (ROM) AND STRETCHING EXERCISES - Low Back Pain Most people with lower back pain will find that their symptoms get worse with excessive bending forward (flexion) or arching at the lower back (extension). The exercises that will help resolve your symptoms will focus on the opposite motion.  If you have pain, numbness or tingling which travels down into your buttocks, leg or foot, the goal of the therapy is for these symptoms to move closer to your back and eventually resolve. Sometimes, these leg symptoms will get better, but your lower back pain may worsen. This is often an indication of progress in your rehabilitation. Be very alert to any changes in your symptoms and the activities in which you participated in the 24 hours prior to the change. Sharing this information with your caregiver will allow him or her to most efficiently treat your condition. These exercises may help you when beginning to rehabilitate your injury. Your symptoms may resolve with or without further involvement from your physician, physical therapist or athletic trainer. While completing these exercises, remember:  Restoring tissue flexibility helps normal motion to return to the joints. This allows healthier, less painful movement and activity. An effective stretch should be held for at least 30 seconds. A stretch should never be painful. You should only feel a gentle lengthening or release in the stretched tissue. FLEXION RANGE OF MOTION AND STRETCHING EXERCISES:  STRETCH - Flexion, Single Knee to Chest  Lie on a firm bed or floor with both legs extended in front of you. Keeping one leg in contact with the floor,  bring your opposite knee to your chest. Hold your leg in place by either grabbing behind your thigh or at your knee. Pull until you feel a gentle stretch in your low back. Hold 30 seconds. Slowly release your grasp and repeat the exercise with the opposite side. Repeat 2 times. Complete this exercise 3 times per week.   STRETCH - Flexion, Double Knee to Chest Lie on a firm bed or floor with both legs extended in front of you. Keeping one leg in contact with the floor, bring your opposite knee to your chest. Tense your stomach muscles to support your back and then lift your other knee to your chest. Hold your legs in place by either grabbing behind your thighs or at your knees. Pull both knees toward your chest until you feel a gentle stretch in your low back. Hold 30 seconds. Tense your stomach muscles and slowly return one leg at a time to the floor. Repeat 2 times. Complete this exercise 3 times per week.   STRETCH - Low Trunk Rotation Lie on a firm bed or floor. Keeping your legs in front of you, bend your knees so they are both pointed toward the ceiling and your feet are flat on the floor. Extend your arms out to the side. This will stabilize your upper body by keeping your shoulders in contact with the floor. Gently and slowly drop both knees together to one side until you feel a gentle stretch in your low back. Hold for 30 seconds. Tense your stomach muscles to support your lower back   as you bring your knees back to the starting position. Repeat the exercise to the other side. Repeat 2 times. Complete this exercise at least 3 times per week.   EXTENSION RANGE OF MOTION AND FLEXIBILITY EXERCISES:  STRETCH - Extension, Prone on Elbows  Lie on your stomach on the floor, a bed will be too soft. Place your palms about shoulder width apart and at the height of your head. Place your elbows under your shoulders. If this is too painful, stack pillows under your chest. Allow your body to relax  so that your hips drop lower and make contact more completely with the floor. Hold this position for 30 seconds. Slowly return to lying flat on the floor. Repeat 2 times. Complete this exercise 3 times per week.   RANGE OF MOTION - Extension, Prone Press Ups Lie on your stomach on the floor, a bed will be too soft. Place your palms about shoulder width apart and at the height of your head. Keeping your back as relaxed as possible, slowly straighten your elbows while keeping your hips on the floor. You may adjust the placement of your hands to maximize your comfort. As you gain motion, your hands will come more underneath your shoulders. Hold this position 30 seconds. Slowly return to lying flat on the floor. Repeat 2 times. Complete this exercise 3 times per week.   RANGE OF MOTION- Quadruped, Neutral Spine  Assume a hands and knees position on a firm surface. Keep your hands under your shoulders and your knees under your hips. You may place padding under your knees for comfort. Drop your head and point your tailbone toward the ground below you. This will round out your lower back like an angry cat. Hold this position for 30 seconds. Slowly lift your head and release your tail bone so that your back sags into a large arch, like an old horse. Hold this position for 30 seconds. Repeat this until you feel limber in your low back. Now, find your "sweet spot." This will be the most comfortable position somewhere between the two previous positions. This is your neutral spine. Once you have found this position, tense your stomach muscles to support your low back. Hold this position for 30 seconds. Repeat 2 times. Complete this exercise 3 times per week.   Hip Exercises It is normal to feel mild stretching, pulling, tightness, or discomfort as you do these exercises, but you should stop right away if you feel sudden pain or your pain gets worse.   STRETCHING AND RANGE OF MOTION EXERCISES These  exercises warm up your muscles and joints and improve the movement and flexibility of your hip. These exercises also help to relieve pain, numbness, and tingling. Exercise A: Hamstrings, Supine  Lie on your back. Loop a belt or towel over the ball of your left / right foot. The ball of your foot is on the walking surface, right under your toes. Straighten your left / right knee and slowly pull on the belt to raise your leg. Do not let your left / right knee bend while you do this. Keep your other leg flat on the floor. Raise the left / right leg until you feel a gentle stretch behind your left / right knee or thigh. Hold this position for 30 seconds. Slowly return your leg to the starting position. Repeat2 times. Complete this stretch 3 times per week. Exercise B: Hip Rotators  Lie on your back on a firm surface. Hold your  left / right knee with your left / right hand. Hold your ankle with your other hand. Gently pull your left / right knee and rotate your lower leg toward your other shoulder. Pull until you feel a stretch in your buttocks. Keep your hips and shoulders firmly planted while you do this stretch. Hold this position for 30 seconds. Repeat 2 times. Complete this stretch 3 times per week. Exercise C: V-Sit (Hamstrings and Adductors)  Sit on the floor with your legs extended in a large "V" shape. Keep your knees straight during this exercise. Start with your head and chest upright, then bend at your waist to reach for your left foot (position A). You should feel a stretch in your right inner thigh. Hold this position for 30 seconds. Then slowly return to the upright position. Bend at your waist to reach forward (position B). You should feel a stretch behind both of your thighs and knees. Hold this position for 30 seconds. Then slowly return to the upright position. Bend at your waist to reach for your right foot (position C). You should feel a stretch in your left inner  thigh. Hold this position for 30 seconds. Then slowly return to the upright position. Repeat A, B, and C 2 times each. Complete this stretch 3 times per week. Exercise D: Lunge (Hip Flexors)  Place your left / right knee on the floor and bend your other knee so that is directly over your ankle. You should be half-kneeling. Keep good posture with your head over your shoulders. Tighten your buttocks to point your tailbone downward. This helps your back to keep from arching too much. You should feel a gentle stretch in the front of your left / right thigh and hip. If you do not feel any resistance, slightly slide your other foot forward and then slowly lunge forward so your knee once again lines up over your ankle. Make sure your tailbone continues to point downward. Hold this position for 30 seconds. Repeat 2 times. Complete this stretch 3 times per week.  STRENGTHENING EXERCISES These exercises build strength and endurance in your hip. Endurance is the ability to use your muscles for a long time, even after they get tired. Exercise E: Bridge (Hip Extensors)  Lie on your back on a firm surface with your knees bent and your feet flat on the floor. Tighten your buttocks muscles and lift your bottom off the floor until the trunk of your body is level with your thighs. Do not arch your back. You should feel the muscles working in your buttocks and the back of your thighs. If you do not feel these muscles, slide your feet 1-2 inches (2.5-5 cm) farther away from your buttocks. Hold this position for 3 seconds. Slowly lower your hips to the starting position. Repeat for a total of 10 repetitions. Let your muscles relax completely between repetitions. If this exercise is too easy, try doing it with your arms crossed over your chest. Repeat 2 times. Complete this exercise 3 times per week. Exercise F: Straight Leg Raises - Hip Abductors  Lie on your side with your left / right leg in the top  position. Lie so your head, shoulder, knee, and hip line up with each other. You may bend your bottom knee to help you balance. Roll your hips slightly forward, so your hips are stacked directly over each other and your left / right knee is facing forward. Leading with your heel, lift your top leg 4-6  inches (10-15 cm). You should feel the muscles in your outer hip lifting. Do not let your foot drift forward. Do not let your knee roll toward the ceiling. Hold this position for 1 second. Slowly return to the starting position. Let your muscles relax completely between repetitions. Repeat for a total of 10 repetitions.  Repeat 2 times. Complete this exercise 3 times per week. Exercise G: Straight Leg Raises - Hip Adductors  Lie on your side with your left / right leg in the bottom position. Lie so your head, shoulder, knee, and hip line up. You may place your upper foot in front to help you balance. Roll your hips slightly forward, so your hips are stacked directly over each other and your left / right knee is facing forward. Tense the muscles in your inner thigh and lift your bottom leg 4-6 inches (10-15 cm). Hold this position for 1 second. Slowly return to the starting position. Let your muscles relax completely between repetitions. Repeat for a total of 10 repetitions. Repeat 2 times. Complete this exercise 3 times per week. Exercise H: Straight Leg Raises - Quadriceps  Lie on your back with your left / right leg extended and your other knee bent. Tense the muscles in the front of your left / right thigh. When you do this, you should see your kneecap slide up or see increased dimpling just above your knee. Tighten these muscles even more and raise your leg 4-6 inches (10-15 cm) off the floor. Hold this position for 3 seconds. Keep these muscles tense as you lower your leg. Relax the muscles slowly and completely between repetitions. Repeat for a total of 10 repetitions. Repeat 2 times.  Complete this exercise 3 times per week. Exercise I: Hip Abductors, Standing Tie one end of a rubber exercise band or tubing to a secure surface, such as a table or pole. Loop the other end of the band or tubing around your left / right ankle. Keeping your ankle with the band or tubing directly opposite of the secured end, step away until there is tension in the tubing or band. Hold onto a chair as needed for balance. Lift your left / right leg out to your side. While you do this: Keep your back upright. Keep your shoulders over your hips. Keep your toes pointing forward. Make sure to use your hip muscles to lift your leg. Do not "throw" your leg or tip your body to lift your leg. Hold this position for 1 second. Slowly return to the starting position. Repeat for a total of 10 repetitions. Repeat 2 times. Complete this exercise 3 times per week. Exercise J: Squats (Quadriceps) Stand in a door frame so your feet and knees are in line with the frame. You may place your hands on the frame for balance. Slowly bend your knees and lower your hips like you are going to sit in a chair. Keep your lower legs in a straight-up-and-down position. Do not let your hips go lower than your knees. Do not bend your knees lower than told by your health care provider. If your hip pain increases, do not bend as low. Hold this position for 1 second. Slowly push with your legs to return to standing. Do not use your hands to pull yourself to standing. Repeat for a total of 10 repetitions. Repeat 2 times. Complete this exercise 3 times per week. Make sure you discuss any questions you have with your health care provider. Document Released: 09/04/2005 Document  Revised: 05/11/2016 Document Reviewed: 08/12/2015 Elsevier Interactive Patient Education  [employer].

## 2021-05-21 NOTE — Progress Notes (Signed)
Musculoskeletal Exam  Patient: Dustin Goodman DOB: 27-Sep-1965  DOS: 05/21/2021  SUBJECTIVE:  Chief Complaint:   Chief Complaint  Patient presents with   Back Pain    Dustin Goodman is a 55 y.o.  male for evaluation and treatment of back pain.   Onset:  2 weeks ago. No inj or change in activity.  Location: lower L  Character:  stabbing Progression of issue:  has worsened Associated symptoms: radiates to hip and groin region Denies bowel/bladder incontinence or weakness, redness, bruising, swelling No hx of kidney stones.  Treatment: to date has been acetaminophen and heat.   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 No acute distress. HENT: Normocephalic, atraumatic.  Thorax & Lungs:  No accessory muscle use Musculoskeletal: low back.   Tenderness to palpation: no Deformity: no Ecchymosis: no There was pain with resisted hip flexion on the L Straight leg test: negative for Poor hamstring flexibility b/l. Neurologic: Normal sensory function. No focal deficits noted. DTR's equal and symmetric in LE's. No clonus. Gait antalgic.  Psychiatric: Normal mood. Age appropriate judgment and insight. Alert & oriented x 3.    Assessment:  Acute left-sided low back pain without sciatica - Plan: tiZANidine (ZANAFLEX) 4 MG tablet, predniSONE (DELTASONE) 20 MG tablet  Hip flexor tendinitis, left - Plan: tiZANidine (ZANAFLEX) 4 MG tablet, predniSONE (DELTASONE) 20 MG tablet  Plan: Hip flexor is likely culprit. Stretches/exercises, heat, ice, Tylenol, 5 d pred burst given hives w  ibuprofen.  F/u prn. The patient voiced understanding and agreement to the plan.   [place], DO 05/21/21  3:27 PM

## 2021-06-30 ENCOUNTER — Telehealth: Payer: Self-pay

## 2021-06-30 NOTE — Telephone Encounter (Signed)
Patient Name: Dustin Goodman Gender: Male DOB: June 13, 1966 Client [facility] Night - Client Client Site [facility] - Night Physician Kathlene November - MD Contact Type Call Who Is Calling Patient / Member / Family / Caregiver Call Type Triage / Clinical Relationship To Patient Self Return Phone Number [phone] (Primary) Chief Complaint Headache Reason for Call Symptomatic / Request for [place] is COVID+ and needs medication. He has a bad headache with bodyache. Translation No Nurse Assessment Nurse: Self, RN, Nira Conn Date/Time (Eastern Time): 06/28/2021 10:44:49 AM Confirm and document reason for call. If symptomatic, describe symptoms. ---Caller has Covid , HA and Body Aches. Does the patient have any new or worsening symptoms? ---Yes Will a triage be completed? ---Yes Related visit to physician within the last 2 weeks? ---No Does the PT have any chronic conditions? (i.e. diabetes, asthma, this includes High risk factors for pregnancy, etc.) ---Yes List chronic conditions. ---D mellitus , HX CA Is this a behavioral health or substance abuse call? ---No Guidelines Guideline Title Affirmed Question Affirmed Notes Nurse Date/Time ([place]rett Time) COVID-19 - Diagnosed or Suspected [1] COVID-19 diagnosed by positive lab test (e.g., PCR, rapid self-test kit) AND [2] mild symptoms (e.g., cough, fever, others) AND [8] no complications or SOB Self, RN, Heather 06/28/2021 10:45:54 AM Disp. Time Eilene Ghazi Time) Disposition Final User PLEASE NOTE: All timestamps contained within this report are represented as [place] Standard Time. CONFIDENTIALTY NOTICE: This fax transmission is intended only for the addressee. It contains information that is legally privileged, confidential or otherwise protected from use or disclosure. If you are not the intended recipient, you are strictly prohibited from reviewing,  disclosing, copying using or disseminating any of this information or taking any action in reliance on or regarding this information. If you have received this fax in error, please notify us immediately by telephone so that we can arrange for its return to [place]. Phone: [phone], Toll-Free: [phone], Fax: [phone] Page: 2 of 2 Call Id: [id number]

## 2021-06-30 NOTE — Telephone Encounter (Signed)
Tried calling Pt- per Dr. Larose Kells okay to add him to virtual today at 3:20pm- no answer, VM not left.

## 2021-08-05 DIAGNOSIS — C099 Malignant neoplasm of tonsil, unspecified: Secondary | ICD-10-CM | POA: Diagnosis not present

## 2021-08-12 DIAGNOSIS — E119 Type 2 diabetes mellitus without complications: Secondary | ICD-10-CM | POA: Diagnosis not present

## 2021-08-12 LAB — HEMOGLOBIN A1C: Hemoglobin A1C: 7.4

## 2021-08-12 LAB — MICROALBUMIN, URINE: Microalb, Ur: <0.7

## 2021-11-12 ENCOUNTER — Encounter: Payer: Self-pay | Admitting: Internal Medicine

## 2021-11-13 ENCOUNTER — Encounter: Payer: Self-pay | Admitting: Internal Medicine

## 2021-11-24 ENCOUNTER — Other Ambulatory Visit: Payer: Self-pay | Admitting: Internal Medicine

## 2021-11-26 ENCOUNTER — Other Ambulatory Visit: Payer: Self-pay | Admitting: Internal Medicine

## 2021-12-22 ENCOUNTER — Other Ambulatory Visit: Payer: Self-pay | Admitting: Internal Medicine

## 2022-01-17 ENCOUNTER — Other Ambulatory Visit: Payer: Self-pay | Admitting: Internal Medicine

## 2022-01-28 ENCOUNTER — Other Ambulatory Visit: Payer: Self-pay | Admitting: Internal Medicine

## 2022-02-15 ENCOUNTER — Other Ambulatory Visit: Payer: Self-pay | Admitting: Internal Medicine

## 2022-02-24 LAB — LIPID PANEL
Cholesterol: 123 (ref 0–200)
HDL: 48 (ref 35–70)
LDL Cholesterol: 59
Triglycerides: 81 (ref 40–160)

## 2022-02-24 LAB — BASIC METABOLIC PANEL
BUN: 10 (ref 4–21)
CO2: 30 — AB (ref 13–22)
Chloride: 103 (ref 99–108)
Creatinine: 0.7 (ref 0.6–1.3)
Glucose: 158
Potassium: 4.2 mEq/L (ref 3.5–5.1)
Sodium: 140 (ref 137–147)

## 2022-02-24 LAB — HEPATIC FUNCTION PANEL
ALT: 21 U/L (ref 10–40)
AST: 19 (ref 14–40)
Alkaline Phosphatase: 99 (ref 25–125)
Bilirubin, Total: 1.1

## 2022-02-24 LAB — COMPREHENSIVE METABOLIC PANEL
Albumin: 4.7 (ref 3.5–5.0)
Calcium: 9.2 (ref 8.7–10.7)
eGFR: 110

## 2022-02-24 LAB — MICROALBUMIN, URINE: Microalb, Ur: 1.64

## 2022-02-24 LAB — PROTEIN / CREATININE RATIO, URINE: Creatinine, Urine: 66

## 2022-02-24 LAB — HM DIABETES FOOT EXAM: HM Diabetic Foot Exam: NORMAL

## 2022-02-24 LAB — HEMOGLOBIN A1C: Hemoglobin A1C: 7.6

## 2022-03-22 ENCOUNTER — Other Ambulatory Visit: Payer: Self-pay | Admitting: Internal Medicine

## 2022-03-24 ENCOUNTER — Other Ambulatory Visit: Payer: Self-pay | Admitting: Internal Medicine

## 2022-03-27 ENCOUNTER — Other Ambulatory Visit: Payer: Self-pay

## 2022-03-27 ENCOUNTER — Ambulatory Visit: Payer: Commercial Managed Care - HMO | Attending: Internal Medicine

## 2022-03-27 DIAGNOSIS — R42 Dizziness and giddiness: Secondary | ICD-10-CM | POA: Diagnosis not present

## 2022-03-27 NOTE — Therapy (Signed)
OUTPATIENT PHYSICAL THERAPY VESTIBULAR EVALUATION     Patient Name: Dustin Goodman MRN: [id number] DOB:Apr 01, 1966, 56 y.o., male Today's Date: 03/27/2022  PCP: Kathlene November REFERRING PROVIDER: Delrae Rend, MD    PT End of Session - 03/27/22 1112     Visit Number 1    Number of Visits 1    PT Start Time 1015    PT Stop Time 1100    PT Time Calculation (min) 45 min    Equipment Utilized During Treatment Gait belt    Activity Tolerance Patient tolerated treatment well    Behavior During Therapy WFL for tasks assessed/performed             Past Medical History:  Diagnosis Date   Allergy    seasonal   Cancer ([place])    tonsil, 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   Cervical radiculopathy    Diabetes mellitus 1998   type II   Hyperlipidemia    Past Surgical History:  Procedure Laterality Date   CERVICAL SPINE SURGERY  10/25/2018   C3 to C7    CHOLECYSTECTOMY  1998   COLONOSCOPY  04/04/2021   MS   NECK DISSECTION Left 07/2018   TORS and left neck dissection for tonsillar cancer   SHOULDER ARTHROSCOPY Right 2010    Dr Onnie Graham    Patient Active Problem List   Diagnosis Date Noted   Swallowing dysfunction 10/06/2019   Hypernatremia 07/12/2018   Dehydration 07/12/2018   DKA (diabetic ketoacidoses) 07/12/2018   Lactic acid acidosis 07/12/2018   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 03/17/2016   Erectile dysfunction 10/25/2014   Irritability and anger 03/09/2012   General medical examination 11/26/2011   Back pain 06/04/2010   High cholesterol 09/13/2009   ALLERGIC RHINITIS DUE TO POLLEN 01/14/2009   ALLERGIC URTICARIA 01/14/2009   ACHILLES TENDINITIS 07/19/2007   DMII (diabetes mellitus, type 2) ([place]) 01/13/2007    ONSET DATE: 02/25/22   REFERRING DIAG: H81.10 (ICD-10-CM) - BPPV (benign paroxysmal positional vertigo)   THERAPY  DIAG:  Dizziness and giddiness  Rationale for Evaluation and Treatment Rehabilitation  SUBJECTIVE:   SUBJECTIVE STATEMENT: Had 2 episodes of vertigo about a month apart. He was playing video game when he got up, he started feeling room was spinning and he started feeling dizzy and nauseated. He got up with help of his wife and went to bathroom and threw up. He laid down and room was spinning and closed the eye. He turned to his left side and fell a sleep. When he woke up, vertigo was resolved and didn't feel it getting out of bed. He played video game that evening and felt fine. Same incident happened again a month apart while he was playing the same video game again. Second episode was same severity and felt nauseated and threw up again. Second episode was about 3-4 weeks ago. Pt accompanied by: self  PERTINENT HISTORY: had throat cancer which was removed by surgery in 2019 and didn't need chemo or radiation. Cervical fusion C2-7.   PAIN:  Are you having pain? No  PRECAUTIONS: None  WEIGHT BEARING RESTRICTIONS No  FALLS: Has patient fallen in last 6 months? No  LIVING ENVIRONMENT: Lives with: lives with their spouse Lives in: House/apartment  PLOF: Independent  PATIENT GOALS not get dizzy.  OBJECTIVE:    COGNITION: Overall cognitive status: Within functional limits for  tasks assessed     Cervical ROM:    Active A/PROM (deg) eval  Flexion   Extension   Right lateral flexion   Left lateral flexion   Right rotation 45  Left rotation 45  (Blank rows = not tested)  GAIT: Gait pattern: WFL Distance walked: 230'   Assistive device utilized: None Level of assistance: SBA  FUNCTIONAL TESTs:  Dynamic Gait Index: 24/24 MCTSIB: Condition 1: Avg of 3 trials: 30 sec, Condition 2: Avg of 3 trials: 30 sec, Condition 3: Avg of 3 trials: 30 sec, Condition 4: Avg of 3 trials: 30 sec, and Total Score: 120/120     VESTIBULAR ASSESSMENT       OCULOMOTOR EXAM:   Ocular  Alignment: normal   Ocular ROM: No Limitations   Spontaneous Nystagmus: absent   Gaze-Induced Nystagmus: absent   Smooth Pursuits: intact   Saccades: intact        VESTIBULAR - OCULAR REFLEX:    Head-Impulse Test: HIT Right: negative HIT Left: negative   Dynamic Visual Acuity: Dynamic: 1 line difference    POSITIONAL TESTING: Right Dix-Hallpike: no nystagmus Left Dix-Hallpike: no nystagmus Right Roll Test: no nystagmus Left Roll Test: no nystagmus     PATIENT EDUCATION: Education details: Patient educated on evaluation findings. His symptoms are not mostly vestibular related but instead may be visual sensitivity related. Pt was educated on moderate the amount of game he plays and limit it to 1-2 hour at a time. This may have happened when he played games for 5+ hours before he started his work. Pt was asked to keep an eye on symptoms and if symptoms develop with movement of head or position changes, he was told to contact his MD for new referral. Person educated: Patient Education method: Explanation Education comprehension: verbalized understanding   GOALS: one time visit, no goals established.   ASSESSMENT:  CLINICAL IMPRESSION: Patient is a 56 y.o. male who was seen today for physical therapy evaluation and treatment for symptoms of dizziness. Evaluation doesn't indicate any vestibular pathology. He may have visual sensitivity with prolonged video games. Patient was advised to moderate his video game playing hours, taking breaks and monitoring symptoms. Pt does not require skilled PT at this time. If visual sensitivity worsens he may benefit from referral to rehabilitation place that works on visual sensitivity impairments..    OBJECTIVE IMPAIRMENTS  none .   ACTIVITY LIMITATIONS  none  PARTICIPATION LIMITATIONS:  none  PERSONAL FACTORS Age, Time since onset of injury/illness/exacerbation, and 1 comorbidity: hx of throat cancer, cervical fusion  are also affecting  patient's functional outcome.   REHAB POTENTIAL: Excellent  CLINICAL DECISION MAKING: Stable/uncomplicated  EVALUATION COMPLEXITY: Low   PLAN: PT FREQUENCY: one time visit    Kerrie Pleasure, PT 03/27/2022, 11:18 AM

## 2022-04-15 ENCOUNTER — Ambulatory Visit (INDEPENDENT_AMBULATORY_CARE_PROVIDER_SITE_OTHER): Payer: Commercial Managed Care - HMO | Admitting: Internal Medicine

## 2022-04-15 ENCOUNTER — Encounter: Payer: Self-pay | Admitting: Internal Medicine

## 2022-04-15 VITALS — BP 126/76 | HR 74 | Temp 97.8°F | Resp 16 | Ht 69.0 in | Wt 218.0 lb

## 2022-04-15 DIAGNOSIS — Z125 Encounter for screening for malignant neoplasm of prostate: Secondary | ICD-10-CM | POA: Diagnosis not present

## 2022-04-15 DIAGNOSIS — E119 Type 2 diabetes mellitus without complications: Secondary | ICD-10-CM | POA: Diagnosis not present

## 2022-04-15 DIAGNOSIS — Z23 Encounter for immunization: Secondary | ICD-10-CM

## 2022-04-15 DIAGNOSIS — E7800 Pure hypercholesterolemia, unspecified: Secondary | ICD-10-CM

## 2022-04-15 DIAGNOSIS — Z0000 Encounter for general adult medical examination without abnormal findings: Secondary | ICD-10-CM

## 2022-04-15 MED ORDER — ATORVASTATIN CALCIUM 20 MG PO TABS: 20.0000 mg | ORAL_TABLET | Freq: Every day | ORAL | 3 refills | Status: DC

## 2022-04-15 MED ORDER — DAPAGLIFLOZIN PRO-METFORMIN ER 5-1000 MG PO TB24
1.0000 | ORAL_TABLET | Freq: Every day | ORAL | Status: DC
Start: 2022-04-15 — End: 2022-04-16

## 2022-04-15 NOTE — Assessment & Plan Note (Signed)
-  Td 2022. - Pnm 23: 10-2014;  Prevnar 2017 - shingrix: #2 today -Covid VAX  booster discussed -CCS: had a  (+) hemocult  >> Cscope 03-2021, +polyps, next 2025 -Prostate cancer screening: DRE-PSA wnl 2022.  Recheck PSA.  No symptoms   -Lifestyle: Discussed -Labs: Per  [facility], had labs 02/24/2022: A1c 7.6, cholesterol 123, creatinine 0.7, LDL 59.  Potassium 4.2.  Will check a CBC, PSA. -Advance directive discussed

## 2022-04-15 NOTE — Progress Notes (Signed)
Subjective:    Patient ID: Dustin Goodman, male    DOB: [birth year], 56 y.o.   MRN: [id number]  DOS:  04/15/2022 Type of visit - description: CPX  For CPX In general feeling well. Did have 2 episodes of vertigo, was referred to PT, symptoms resolved. Denies chest pain or difficulty breathing   Review of Systems  Other than above, a 14 point review of systems is negative     Past Medical History:  Diagnosis Date   Allergy    seasonal   Cancer ([place])    tonsil, 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   Cervical radiculopathy    Diabetes mellitus 1998   type II   Hyperlipidemia     Past Surgical History:  Procedure Laterality Date   CERVICAL SPINE SURGERY  10/25/2018   C3 to C7    CHOLECYSTECTOMY  1998   COLONOSCOPY  04/04/2021   MS   NECK DISSECTION Left 07/2018   TORS and left neck dissection for tonsillar cancer   SHOULDER ARTHROSCOPY Right 2010    Dr Onnie Graham    Social History   Socioeconomic History   Marital status: Married    Spouse name: Not on file   Number of children: 1   Years of education: Not on file   Highest education level: Not on file  Occupational History   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  Tobacco Use   Smoking status: Never   Smokeless tobacco: Never  Vaping Use   Vaping Use: Never used  Substance and Sexual Activity   Alcohol use: Yes    Comment: rarely   Drug use: No   Sexual activity: Yes  Other Topics Concern   Not on file  Social History Narrative   Daughter 2010   Social Determinants of Health   Financial Resource Strain: Not on file  Food Insecurity: Not on file  Transportation Needs: No Transportation Needs (06/28/2018)   PRAPARE - Hydrologist (Medical): No    Lack of Transportation (Non-Medical): No  Physical Activity: Not on file  Stress: Not on file  Social Connections: Not on file  Intimate Partner Violence: Not At Risk (06/28/2018)   Humiliation, Afraid, Rape, and Kick questionnaire    Fear  of Current or Ex-Partner: No    Emotionally Abused: No    Physically Abused: No    Sexually Abused: No    Current Outpatient Medications  Medication Instructions   atorvastatin (LIPITOR) 20 mg, Oral, Daily   Blood Glucose Monitoring Suppl (ONETOUCH VERIO) w/Device KIT Check blood sugar 4 time daily   Dapagliflozin-metFORMIN HCl ER 12-998 MG TB24 1 tablet, Oral, Daily with breakfast   Dulaglutide (TRULICITY) 1.5 KD/3.2IZ SOPN Subcutaneous   glucose blood (ONETOUCH VERIO) test strip Check blood sugar 4 times daily   levocetirizine (XYZAL) 5 mg, Oral, Daily   Multiple Vitamins-Minerals (CENTRUM PO) 1 tablet, Oral, Daily,     ONE TOUCH LANCETS MISC Check blood sugars 4 times daily       Objective:   Physical Exam BP 126/76   Pulse 74   Temp 97.8 F (36.6 C) (Oral)   Resp 16   Ht _0  (1.753 m)   Wt 218 lb (98.9 kg)   SpO2 96%   BMI 32.19 kg/m  General: Well developed, NAD, BMI noted Neck: No  thyromegaly  HEENT:  Normocephalic . Face symmetric, atraumatic Lungs:  CTA B Normal respiratory effort, no intercostal retractions, no accessory muscle use. Heart: RRR,  no  murmur.  Abdomen:  Not distended, soft, non-tender. No rebound or rigidity.  Diastases recti noted. Lower extremities: no pretibial edema bilaterally  Skin: Exposed areas without rash. Not pale. Not jaundice Neurologic:  alert & oriented X3.  Speech normal, gait appropriate for age and unassisted Strength symmetric and appropriate for age.  Psych: Cognition and judgment appear intact.  Cooperative with normal attention span and concentration.  Behavior appropriate. No anxious or depressed appearing.     Assessment    Assessment Diabetes- saw Dr Buddy Duty from 2013 to 02-2016, back to endo 9/ 2019  Hyperlipidemia Seasonal allergies Tonsil cancer, s/p tors and left neck dissection 07-2018 Cervical spine surgery 10/2018 Fall at home 08/2020: Left L1-3 TP fractures and a non-displcaed left L4 TP fracture,  treat conservatively  Diastasis recti  Plan: Here for CPX DM: Per Dr. Buddy Duty, reportedly was seen few weeks ago.  Check micro. Hyperlipidemia: Ran out of Lipitor 2 weeks ago, RF sent.  Labs 01/2022 were good.. H/o Tonsil cancer: Saw ENT 02/03/2022, felt to be stable with no evidence of recurrence, next visit 07/2022 Diastasis recti: founs on exam today, rx observation, seek attention if acute pain or nausea. Social: Lost his job at [employer] 07-2021 after 35 years w/ the company.  Fortunately he is now working at another [employer] and feeling very well about it. RTC 1 year.

## 2022-04-15 NOTE — Assessment & Plan Note (Signed)
Here for CPX DM: Per Dr. Buddy Duty, reportedly was seen few weeks ago.  Check micro. Hyperlipidemia: Ran out of Lipitor 2 weeks ago, RF sent.  Labs 01/2022 were good.. H/o Tonsil cancer: Saw ENT 02/03/2022, felt to be stable with no evidence of recurrence, next visit 07/2022 Diastasis recti: founs on exam today, rx observation, seek attention if acute pain or nausea. Social: Lost his job at [employer] 07-2021 after 35 years w/ the company.  Fortunately he is now working at another [employer] and feeling very well about it. RTC 1 year.

## 2022-04-15 NOTE — Patient Instructions (Addendum)
Recommend to proceed with the following vaccines at your pharmacy:  Covid #5  Flu shot this fall     GO TO THE LAB : Get the blood work     [place], [place] back for a physical exam in 1 year.  Per our records you are due for your diabetic eye exam. Please contact your eye doctor to schedule an appointment. Please have them send copies of your office visit notes to [place]. Our fax number is [phone]. If you need a referral to an eye doctor please let us know.      "Living will", "[place] of attorney": Advanced care planning  (If you already have a living will or healthcare power of attorney, please bring the copy to be scanned in your chart.)  Advance care planning is a process that supports adults in  understanding and sharing their preferences regarding future medical care.   The patient's preferences are recorded in documents called Advance Directives.    Advanced directives are completed (and can be modified at any time) while the patient is in full mental capacity.   The documentation should be available at all times to the patient, the family and the healthcare providers.  Bring in a copy to be scanned in your chart is an excellent idea and is recommended   This legal documents direct treatment decision making and/or appoint a surrogate to make the decision if the patient is not capable to do so.    Advance directives can be documented in many types of formats,  documents have names such as:  Lliving will  Durable power of attorney for healthcare (healthcare proxy or healthcare power of attorney)  Combined directives  Physician orders for life-sustaining treatment    More information at:  [email or web address]

## 2022-04-16 ENCOUNTER — Encounter: Payer: Self-pay | Admitting: Internal Medicine

## 2022-04-16 LAB — CBC WITH DIFFERENTIAL/PLATELET
Basophils Absolute: 0.1 10*3/uL (ref 0.0–0.1)
Basophils Relative: 1.1 % (ref 0.0–3.0)
Eosinophils Absolute: 0.1 10*3/uL (ref 0.0–0.7)
Eosinophils Relative: 1.5 % (ref 0.0–5.0)
HCT: 46 % (ref 39.0–52.0)
Hemoglobin: 15.8 g/dL (ref 13.0–17.0)
Lymphocytes Relative: 31.9 % (ref 12.0–46.0)
Lymphs Abs: 2.3 10*3/uL (ref 0.7–4.0)
MCHC: 34.3 g/dL (ref 30.0–36.0)
MCV: 90.2 fl (ref 78.0–100.0)
Monocytes Absolute: 0.8 10*3/uL (ref 0.1–1.0)
Monocytes Relative: 10.7 % (ref 3.0–12.0)
Neutro Abs: 4 10*3/uL (ref 1.4–7.7)
Neutrophils Relative %: 54.8 % (ref 43.0–77.0)
Platelets: 275 10*3/uL (ref 150.0–400.0)
RBC: 5.1 Mil/uL (ref 4.22–5.81)
RDW: 12.8 % (ref 11.5–15.5)
WBC: 7.2 10*3/uL (ref 4.0–10.5)

## 2022-04-16 LAB — MICROALBUMIN / CREATININE URINE RATIO
Creatinine,U: 53.9 mg/dL
Microalb Creat Ratio: 1.3 mg/g (ref 0.0–30.0)
Microalb, Ur: <0.7 mg/dL (ref 0.0–1.9)

## 2022-04-16 LAB — PSA: PSA: 0.66 ng/mL (ref 0.10–4.00)

## 2022-05-06 ENCOUNTER — Other Ambulatory Visit: Payer: Self-pay | Admitting: Internal Medicine

## 2022-05-30 ENCOUNTER — Other Ambulatory Visit: Payer: Self-pay | Admitting: Internal Medicine

## 2022-12-31 ENCOUNTER — Encounter: Payer: Self-pay | Admitting: Internal Medicine

## 2023-01-01 ENCOUNTER — Ambulatory Visit: Payer: Managed Care, Other (non HMO) | Admitting: Internal Medicine

## 2023-01-01 VITALS — BP 122/64 | HR 66 | Temp 97.7°F | Resp 18 | Ht 69.0 in | Wt 218.0 lb

## 2023-01-01 DIAGNOSIS — G479 Sleep disorder, unspecified: Secondary | ICD-10-CM

## 2023-01-01 DIAGNOSIS — M436 Torticollis: Secondary | ICD-10-CM

## 2023-01-01 DIAGNOSIS — M545 Low back pain, unspecified: Secondary | ICD-10-CM | POA: Diagnosis not present

## 2023-01-01 NOTE — Patient Instructions (Addendum)
Will refer you for Physical Therapy  Please call if not better      Per our records you are due for your diabetic eye exam. Please contact your eye doctor to schedule an appointment. Please have them send copies of your office visit notes to [place]. Our fax number is [phone]. If you need a referral to an eye doctor please let us know.

## 2023-01-01 NOTE — Progress Notes (Signed)
Subjective:    Patient ID: Dustin Goodman, male    DOB: [birth year], 57 y.o.   MRN: [id number]  DOS:  01/01/2023 Type of visit - description: acute   His concern is OSA but describes sxs as follows:  Has a long history of being forced to sleep on his back  -rather than his preferred position which is on the abdomen-    due to neck pain & poor neck flexibility due to previous surgery;  he typically twist and turn during the night and  wakes up w/ severe low back pain some days. He feels also unrested and fatigue because he cannot sleep well.  Wife noted that he snores sometimes. The back pain occasionally radiates to the left leg. Denies lower extremity paresthesias Sometimes the left leg feels slightly weak when he goes upstairs.   Wt Readings from Last 3 Encounters:  01/01/23 218 lb (98.9 kg)  04/15/22 218 lb (98.9 kg)  05/21/21 217 lb 6 oz (98.6 kg)     Review of Systems See above   Past Medical History:  Diagnosis Date   Allergy    seasonal   Cancer ([facility])    tonsil, 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   Cervical radiculopathy    Diabetes mellitus 1998   type II   Hyperlipidemia     Past Surgical History:  Procedure Laterality Date   CERVICAL SPINE SURGERY  10/25/2018   C3 to C7    CHOLECYSTECTOMY  1998   COLONOSCOPY  04/04/2021   MS   NECK DISSECTION Left 07/2018   TORS and left neck dissection for tonsillar cancer   SHOULDER ARTHROSCOPY Right 2010    Dr Rennis Chris     Current Outpatient Medications  Medication Instructions   atorvastatin (LIPITOR) 20 mg, Oral, Daily   Blood Glucose Monitoring Suppl (ONETOUCH VERIO) w/Device KIT Check blood sugar 4 time daily   Dapagliflozin-metFORMIN HCl ER (XIGDUO XR) 10-500 MG TB24 1 tablet, Oral, Daily   Dulaglutide (TRULICITY) 4.5 MG/0.5ML SOPN As directed   glucose blood (ONETOUCH VERIO) test strip Check blood sugar 4 times daily   levocetirizine (XYZAL) 5 mg, Oral, Daily   Mounjaro 2.5 mg, Subcutaneous, Weekly    Multiple Vitamins-Minerals (CENTRUM PO) 1 tablet, Oral, Daily,     ONE TOUCH LANCETS MISC Check blood sugars 4 times daily       Objective:   Physical Exam BP 122/64   Pulse 66   Temp 97.7 F (36.5 C) (Oral)   Resp 18   Ht 5\' 9"  (1.753 m)   Wt 218 lb (98.9 kg)   SpO2 98%   BMI 32.19 kg/m  General:   Well developed, NAD, BMI noted. HEENT:  Normocephalic . Face symmetric, atraumatic MSK: No TTP on the low back. Lower extremities: no pretibial edema bilaterally  Skin: Not pale. Not jaundice Neurologic:  alert & oriented X3.  Speech normal, gait appropriate for age and unassisted. DTRs: Normal knee jerks, ankle jerks are slightly decreased. Straight leg test negative. Motor symmetric.  Psych--  Cognition and judgment appear intact.  Cooperative with normal attention span and concentration.  Behavior appropriate. No anxious or depressed appearing.      Assessment     Assessment Diabetes- saw Dr Sharl Ma from 2013 to 02-2016, back to endo 9/ 2019  Hyperlipidemia Seasonal allergies Tonsil cancer, s/p tors and left neck dissection 07-2018 Cervical spine surgery 10/2018 Fall at home 08/2020: Left L1-3 TP fractures and a non-displcaed left L4 TP fracture, treat conservatively  Diastasis recti  PLAN Low back pain, OSA? The patient is concerned about OSA however the way he describes his symptoms makes me think his main problem is he is unable to get in a comfortable position at night  which difficult his sleep leading to fatigue and back pain. Epworth scale is 11. At this point we agree on doing a round of physical therapy to help with low back pain and neck stiffness. That might improve the quality of his sleep and be enough to help with back pain  and sleepiness. If that is not working he will let me know.

## 2023-01-02 NOTE — Assessment & Plan Note (Signed)
Low back pain, OSA? The patient is concerned about OSA however the way he describes his symptoms makes me think his main problem is he is unable to get in a comfortable position at night  which difficult his sleep leading to fatigue and back pain. Epworth scale is 11. At this point we agree on doing a round of physical therapy to help with low back pain and neck stiffness. That might improve the quality of his sleep and be enough to help with back pain  and sleepiness. If that is not working he will let me know.

## 2023-01-08 ENCOUNTER — Telehealth: Payer: Self-pay

## 2023-01-08 NOTE — Telephone Encounter (Signed)
PT plan of care signed and faxed to [facility] at [phone]. Form sent for scanning.

## 2023-03-01 ENCOUNTER — Telehealth: Payer: Self-pay

## 2023-03-01 NOTE — Telephone Encounter (Signed)
Plan of care signed and faxed back to [facility] at [phone]. Form sent for scanning.  

## 2023-04-08 LAB — HEMOGLOBIN A1C: Hemoglobin A1C: 7.4

## 2023-04-08 LAB — HM DIABETES FOOT EXAM: HM Diabetic Foot Exam: NORMAL

## 2023-04-20 ENCOUNTER — Other Ambulatory Visit: Payer: Self-pay | Admitting: Internal Medicine

## 2023-04-21 ENCOUNTER — Encounter: Payer: Self-pay | Admitting: Internal Medicine

## 2023-05-15 ENCOUNTER — Other Ambulatory Visit: Payer: Self-pay | Admitting: Internal Medicine

## 2023-06-09 ENCOUNTER — Other Ambulatory Visit: Payer: Self-pay | Admitting: Internal Medicine

## 2023-06-13 ENCOUNTER — Other Ambulatory Visit: Payer: Self-pay | Admitting: Internal Medicine

## 2023-07-05 ENCOUNTER — Other Ambulatory Visit: Payer: Self-pay | Admitting: Internal Medicine

## 2023-07-13 ENCOUNTER — Other Ambulatory Visit: Payer: Self-pay | Admitting: Internal Medicine

## 2023-07-14 ENCOUNTER — Encounter: Payer: Self-pay | Admitting: Internal Medicine

## 2023-07-23 ENCOUNTER — Encounter: Payer: Self-pay | Admitting: Internal Medicine

## 2023-07-23 ENCOUNTER — Ambulatory Visit: Payer: Managed Care, Other (non HMO) | Admitting: Internal Medicine

## 2023-07-23 VITALS — BP 126/80 | HR 71 | Temp 97.7°F | Resp 18 | Ht 69.0 in | Wt 213.5 lb

## 2023-07-23 DIAGNOSIS — Z0000 Encounter for general adult medical examination without abnormal findings: Secondary | ICD-10-CM | POA: Diagnosis not present

## 2023-07-23 DIAGNOSIS — E7800 Pure hypercholesterolemia, unspecified: Secondary | ICD-10-CM

## 2023-07-23 DIAGNOSIS — Z7984 Long term (current) use of oral hypoglycemic drugs: Secondary | ICD-10-CM | POA: Diagnosis not present

## 2023-07-23 DIAGNOSIS — E119 Type 2 diabetes mellitus without complications: Secondary | ICD-10-CM

## 2023-07-23 MED ORDER — ATORVASTATIN CALCIUM 20 MG PO TABS: 20.0000 mg | ORAL_TABLET | Freq: Every day | ORAL | 1 refills | Status: DC

## 2023-07-23 NOTE — Patient Instructions (Addendum)
  Schedule fasting labs for next week Next visit with me in 6 months     Please schedule it at the front desk      Per our records you are due for your diabetic eye exam. Please contact your eye doctor to schedule an appointment. Please have them send copies of your office visit notes to [place]. Our fax number is [phone]. If you need a referral to an eye doctor please let us know.       "Health Care Power of attorney" ,  "Living will" (Advance care planning documents)  If you already have a living will or healthcare power of attorney, is recommended you bring the copy to be scanned in your chart.   The document will be available to all the doctors you see in the system.  Advance care planning is a process that supports adults in  understanding and sharing their preferences regarding future medical care.  The patient's preferences are recorded in documents called Advance Directives and the can be modified at any time while the patient is in full mental capacity.   If you don't have one, please consider create one.      More information at: [email or web address]

## 2023-07-23 NOTE — Progress Notes (Unsigned)
Subjective:    Patient ID: Dustin Goodman, male    DOB: Jul 11, 1966, 57 y.o.   MRN: [id number]  DOS:  07/23/2023 Type of visit - description: CPX  Since the last office visit is doing well. We again talked about back pain which is bothering him mostly at night. Did physical therapy with some help. His energy level is good.   Review of Systems See above   Past Medical History:  Diagnosis Date   Allergy    seasonal   Cancer ([facility])    tonsil, 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   Cervical radiculopathy    Diabetes mellitus 1998   type II   Hyperlipidemia     Past Surgical History:  Procedure Laterality Date   CERVICAL SPINE SURGERY  10/25/2018   C3 to C7    CHOLECYSTECTOMY  1998   COLONOSCOPY  04/04/2021   MS   NECK DISSECTION Left 07/2018   TORS and left neck dissection for tonsillar cancer   SHOULDER ARTHROSCOPY Right 2010    Dr Rennis Chris     Current Outpatient Medications  Medication Instructions   atorvastatin (LIPITOR) 20 mg, Oral, Daily   Blood Glucose Monitoring Suppl (ONETOUCH VERIO) w/Device KIT Check blood sugar 4 time daily   Dapagliflozin-metFORMIN HCl ER (XIGDUO XR) 10-500 MG TB24 1 tablet, Oral, Daily   glucose blood (ONETOUCH VERIO) test strip Check blood sugar 4 times daily   levocetirizine (XYZAL) 5 mg, Oral, Daily   Mounjaro 10 mg, Subcutaneous, Weekly   Multiple Vitamins-Minerals (CENTRUM PO) 1 tablet, Oral, Daily,     ONE TOUCH LANCETS MISC Check blood sugars 4 times daily       Objective:   Physical Exam BP 126/80   Pulse 71   Temp 97.7 F (36.5 C) (Oral)   Resp 18   Ht 5\' 9"  (1.753 m)   Wt 213 lb 8 oz (96.8 kg)   SpO2 98%   BMI 31.53 kg/m  General: Well developed, NAD, BMI noted Neck: No  thyromegaly  HEENT:  Normocephalic . Face symmetric, atraumatic Lungs:  CTA B Normal respiratory effort, no intercostal retractions, no accessory muscle use. Heart: RRR,  no murmur.  Abdomen:  Not distended, soft, non-tender. No rebound or  rigidity.   Lower extremities: no pretibial edema bilaterally  Skin: Exposed areas without rash. Not pale. Not jaundice Neurologic:  alert & oriented X3.  Speech normal, gait appropriate for age and unassisted Strength symmetric and appropriate for age.  Psych: Cognition and judgment appear intact.  Cooperative with normal attention span and concentration.  Behavior appropriate. No anxious or depressed appearing.     Assessment     Assessment Diabetes- saw Dr Sharl Ma from 2013 to 02-2016, back to endo 9/ 2019  Hyperlipidemia Seasonal allergies Tonsil cancer, s/p tors and left neck dissection 07-2018 Cervical spine surgery 10/2018 Fall at home 08/2020: Left L1-3 TP fractures and a non-displcaed left L4 TP fracture, treat conservatively  Diastasis recti  PLAN Here for CPX  - Td 2022. - Pnm 23: 10-2014;  Prevnar 2017 - shingrix x 2 - had a recent flu-covid vax -CCS: had a  (+) hemocult  >> Cscope 03-2021, +polyps, next 2025 -Prostate cancer screening: Check PSA, no symptoms   -Lifestyle: Discussed -Labs: Will come back fasting for CMP FLP CBC PSA DM: Per Endo.  Due for a visit, encouraged to pursue it. Hyperlipidemia: Refill atorvastatin, checking labs. History of tonsil cancer: Has 1 more follow-up with ENT and then he hopes he  will "graduate". Low back pain: Since last visit saw physical therapy, stretching helps to some degree.  Recommend to see his orthopedic surgeon. Overall, his sleep is occasional disturbed pain but during the daytime he feels well and has no trouble falling asleep or driving. RTC labs next week. RTC office visit 6 months.

## 2023-07-25 ENCOUNTER — Encounter: Payer: Self-pay | Admitting: Internal Medicine

## 2023-07-25 NOTE — Assessment & Plan Note (Signed)
Here for CPX DM: Per Endo.  Due for a visit, encouraged to pursue it. Hyperlipidemia: Refill atorvastatin, checking labs. History of tonsil cancer: Has 1 more follow-up with ENT and then he hopes he will be released from ENT Low back pain: Since last visit saw physical therapy, stretching helps to some degree.  Recommend to see his orthopedic surgeon since he is still symptomatic RTC labs next week. RTC office visit 6 months.

## 2023-07-25 NOTE — Assessment & Plan Note (Signed)
Here for CPX  - Td 2022. - Pnm 23: 10-2014;  Prevnar 2017 - shingrix x 2 - had a recent flu-covid vax -CCS: had a  (+) hemocult  >> Cscope 03-2021, +polyps, next 2025 -Prostate cancer screening: Check PSA, no symptoms - Lifestyle: Discussed -Labs: Will come back fasting for CMP FLP CBC PSA

## 2023-07-28 ENCOUNTER — Other Ambulatory Visit (INDEPENDENT_AMBULATORY_CARE_PROVIDER_SITE_OTHER): Payer: Managed Care, Other (non HMO)

## 2023-07-28 DIAGNOSIS — E7800 Pure hypercholesterolemia, unspecified: Secondary | ICD-10-CM | POA: Diagnosis not present

## 2023-07-28 DIAGNOSIS — Z0000 Encounter for general adult medical examination without abnormal findings: Secondary | ICD-10-CM

## 2023-07-28 DIAGNOSIS — Z125 Encounter for screening for malignant neoplasm of prostate: Secondary | ICD-10-CM

## 2023-07-28 DIAGNOSIS — E119 Type 2 diabetes mellitus without complications: Secondary | ICD-10-CM | POA: Diagnosis not present

## 2023-07-28 LAB — COMPREHENSIVE METABOLIC PANEL
ALT: 16 U/L (ref 0–53)
AST: 14 U/L (ref 0–37)
Albumin: 4.3 g/dL (ref 3.5–5.2)
Alkaline Phosphatase: 75 U/L (ref 39–117)
BUN: 13 mg/dL (ref 6–23)
CO2: 27 meq/L (ref 19–32)
Calcium: 9 mg/dL (ref 8.4–10.5)
Chloride: 104 meq/L (ref 96–112)
Creatinine, Ser: 0.61 mg/dL (ref 0.40–1.50)
GFR: 108.39 mL/min (ref >60.00)
Glucose, Bld: 196 mg/dL — ABNORMAL HIGH (ref 70–99)
Potassium: 4.3 meq/L (ref 3.5–5.1)
Sodium: 138 meq/L (ref 135–145)
Total Bilirubin: 1.2 mg/dL (ref 0.2–1.2)
Total Protein: 6.5 g/dL (ref 6.0–8.3)

## 2023-07-28 LAB — CBC WITH DIFFERENTIAL/PLATELET
Basophils Absolute: 0 10*3/uL (ref 0.0–0.1)
Basophils Relative: 0.4 % (ref 0.0–3.0)
Eosinophils Absolute: 0.1 10*3/uL (ref 0.0–0.7)
Eosinophils Relative: 1.6 % (ref 0.0–5.0)
HCT: 44.1 % (ref 39.0–52.0)
Hemoglobin: 15.2 g/dL (ref 13.0–17.0)
Lymphocytes Relative: 35.9 % (ref 12.0–46.0)
Lymphs Abs: 2.1 10*3/uL (ref 0.7–4.0)
MCHC: 34.6 g/dL (ref 30.0–36.0)
MCV: 90.4 fL (ref 78.0–100.0)
Monocytes Absolute: 0.7 10*3/uL (ref 0.1–1.0)
Monocytes Relative: 11.4 % (ref 3.0–12.0)
Neutro Abs: 2.9 10*3/uL (ref 1.4–7.7)
Neutrophils Relative %: 50.7 % (ref 43.0–77.0)
Platelets: 260 10*3/uL (ref 150.0–400.0)
RBC: 4.87 Mil/uL (ref 4.22–5.81)
RDW: 12.7 % (ref 11.5–15.5)
WBC: 5.7 10*3/uL (ref 4.0–10.5)

## 2023-07-28 LAB — LIPID PANEL
Cholesterol: 129 mg/dL (ref 0–200)
HDL: 40.5 mg/dL (ref >39.00)
LDL Cholesterol: 65 mg/dL (ref 0–99)
NonHDL: 88.21
Total CHOL/HDL Ratio: 3
Triglycerides: 114 mg/dL (ref 0.0–149.0)
VLDL: 22.8 mg/dL (ref 0.0–40.0)

## 2023-07-28 LAB — PSA: PSA: 0.67 ng/mL (ref 0.10–4.00)

## 2023-07-28 LAB — MICROALBUMIN / CREATININE URINE RATIO
Creatinine,U: 83.8 mg/dL
Microalb Creat Ratio: 0.8 mg/g (ref 0.0–30.0)
Microalb, Ur: <0.7 mg/dL (ref 0.0–1.9)

## 2023-11-09 LAB — HM DIABETES FOOT EXAM: HM Diabetic Foot Exam: NORMAL

## 2023-11-09 LAB — HEMOGLOBIN A1C: Hemoglobin A1C: 6.7

## 2023-12-02 ENCOUNTER — Encounter: Payer: Self-pay | Admitting: Internal Medicine

## 2024-01-21 ENCOUNTER — Encounter: Payer: Self-pay | Admitting: Internal Medicine

## 2024-01-21 ENCOUNTER — Ambulatory Visit: Payer: Managed Care, Other (non HMO) | Admitting: Internal Medicine

## 2024-01-23 ENCOUNTER — Other Ambulatory Visit: Payer: Self-pay | Admitting: Internal Medicine

## 2024-04-28 ENCOUNTER — Other Ambulatory Visit: Payer: Self-pay | Admitting: Internal Medicine

## 2024-05-09 LAB — HEMOGLOBIN A1C: Hemoglobin A1C: 6.1

## 2024-05-31 ENCOUNTER — Other Ambulatory Visit: Payer: Self-pay | Admitting: Internal Medicine

## 2024-06-06 ENCOUNTER — Encounter: Payer: Self-pay | Admitting: Internal Medicine

## 2024-06-06 ENCOUNTER — Ambulatory Visit (HOSPITAL_BASED_OUTPATIENT_CLINIC_OR_DEPARTMENT_OTHER)
Admission: RE | Admit: 2024-06-06 | Discharge: 2024-06-06 | Disposition: A | Discharge status: Home / Self Care | Source: Ambulatory Visit | Attending: Internal Medicine | Admitting: Internal Medicine

## 2024-06-06 ENCOUNTER — Ambulatory Visit: Payer: Self-pay | Admitting: Internal Medicine

## 2024-06-06 VITALS — BP 118/68 | HR 70 | Temp 98.3°F | Resp 16 | Ht 69.0 in | Wt 197.2 lb

## 2024-06-06 DIAGNOSIS — Z7985 Long-term (current) use of injectable non-insulin antidiabetic drugs: Secondary | ICD-10-CM

## 2024-06-06 DIAGNOSIS — E119 Type 2 diabetes mellitus without complications: Secondary | ICD-10-CM

## 2024-06-06 DIAGNOSIS — Z1211 Encounter for screening for malignant neoplasm of colon: Secondary | ICD-10-CM

## 2024-06-06 DIAGNOSIS — R634 Abnormal weight loss: Secondary | ICD-10-CM | POA: Diagnosis not present

## 2024-06-06 DIAGNOSIS — M545 Low back pain, unspecified: Secondary | ICD-10-CM | POA: Insufficient documentation

## 2024-06-06 NOTE — Assessment & Plan Note (Signed)
 Unintentional weight loss Weight loss likely due to Mounjaro (tirzepatide) however this happening in the context of back pain and history of neck cancer.  Thus plan to rule out other etiologies by checking general labs, lumbar x-ray and a urinalysis. Low threshold to proceed with CT abdomen to evaluate the retroperitoneal area.    Also recommend to discuss with endocrinologist to adjust Mounjaro dosage. Chronic back pain Pain characteristics suggest spinal origin  - Refer to orthopedic specialist. - Get UA and plain x-rays DM: Managed with Mounjaro and other medications. A1c improved from 6.7 to 6.1. Discuss medication adjustments with endocrinologist. Vaccine advice: Declined vaccines today, recommend flu and COVID-vaccine. Due for colonoscopy.  Will send a referral RTC 2 months.

## 2024-06-06 NOTE — Progress Notes (Signed)
 Subjective:    Patient ID: Dustin Goodman, male    DOB: 12-02-1965, 58 y.o.   MRN: [id number]  DOS:  06/06/2024  Discussed the use of AI scribe software for clinical note transcription with the patient, who gave verbal consent to proceed.  History of Present Illness Follow-up Unintentional weight loss - Weight decreased from 218-223 pounds to 200 pounds over an unspecified period - Weight loss occurred despite reduced physical activity due to a job change - No weight gain during a three-week period off Mounjaro Denies fever or chills.  No nausea vomiting.  No blood in the stools. No blood in the urine. Has not felt any lumps at the neck axillary areas or groin  Glycemic control and hyperglycemic symptoms - History of diabetes mellitus - Off Mounjaro for three weeks due to insurance issues - Increased thirst and suspected hyperglycemia during period off medication - Hemoglobin A1c decreased from 6.7 to 6.1  Chronic lower back pain with radiculopathy? - Chronic lower back pain persisting for over one year - Pain is constant and worsens with prolonged standing - Lying down exacerbates pain; sitting with support sometimes provides relief - Frequently needs to sleep sitting up to avoid pain - Occasional radiation of pain to the buttocks and down the leg - No numbness or weakness in the legs - Previous physical therapy and chiropractic sessions provided only temporary relief      Wt Readings from Last 3 Encounters:  06/06/24 197 lb 4 oz (89.5 kg)  07/23/23 213 lb 8 oz (96.8 kg)  01/01/23 218 lb (98.9 kg)     Review of Systems See above   Past Medical History:  Diagnosis Date   Allergy    seasonal   Cancer ([facility])    tonsil, 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   Cervical radiculopathy    Diabetes mellitus 1998   type II   Hyperlipidemia     Past Surgical History:  Procedure Laterality Date   CERVICAL SPINE SURGERY  10/25/2018   C3 to C7    CHOLECYSTECTOMY  1998    COLONOSCOPY  04/04/2021   MS   NECK DISSECTION Left 07/2018   TORS and left neck dissection for tonsillar cancer   SHOULDER ARTHROSCOPY Right 2010    Dr Melita     Current Outpatient Medications  Medication Instructions   atorvastatin  (LIPITOR) 20 mg, Oral, Daily   Blood Glucose Monitoring Suppl (ONETOUCH VERIO) w/Device KIT Check blood sugar 4 time daily   Dapagliflozin -metFORMIN  HCl ER (XIGDUO  XR) 10-500 MG TB24 1 tablet, Daily   glucose blood (ONETOUCH VERIO) test strip Check blood sugar 4 times daily   levocetirizine (XYZAL ) 5 mg, Oral, Daily   Mounjaro 10 mg, Weekly   Multiple Vitamins-Minerals (CENTRUM PO) 1 tablet, Daily   ONE TOUCH LANCETS MISC Check blood sugars 4 times daily       Objective:   Physical Exam BP 118/68   Pulse 70   Temp 98.3 F (36.8 C) (Oral)   Resp 16   Ht 5' 9 (1.753 m)   Wt 197 lb 4 oz (89.5 kg)   SpO2 97%   BMI 29.13 kg/m  General:   Well developed, NAD, BMI noted.  HEENT:  Normocephalic . Face symmetric, atraumatic Neck: No lymphadenopathy Lungs:  CTA B Normal respiratory effort, no intercostal retractions, no accessory muscle use. Heart: RRR,  no murmur.  Abdomen:  Not distended, soft, non-tender. No rebound or rigidity.  No organomegaly MSK: No TTP at the  lumbosacral spine Skin: Not pale. Not jaundice Lower extremities: no pretibial edema bilaterally  Neurologic:  alert & oriented X3.  Speech normal, gait appropriate for age and unassisted.  DTR symmetric, motor symmetric, straight leg test negative Psych--  Cognition and judgment appear intact.  Cooperative with normal attention span and concentration.  Behavior appropriate. No anxious or depressed appearing.     Assessment   Assessment Diabetes- saw Dr Faythe from 2013 to 02-2016, back to endo 9/ 2019  Hyperlipidemia Seasonal allergies Tonsil cancer, s/p tors and left neck dissection 07-2018 Cervical spine surgery 10/2018 Fall at home 08/2020: Left L1-3 TP fractures and a  non-displcaed left L4 TP fracture, treat conservatively  Diastasis recti  Assessment & Plan Unintentional weight loss Weight loss likely due to Mounjaro (tirzepatide) however this happening in the context of back pain and history of neck cancer.  Thus plan to rule out other etiologies by checking general labs, lumbar x-ray and a urinalysis. Low threshold to proceed with CT abdomen to evaluate the retroperitoneal area.    Also recommend to discuss with endocrinologist to adjust Mounjaro dosage. Chronic back pain Pain characteristics suggest spinal origin  - Refer to orthopedic specialist. - Get UA and plain x-rays DM: Managed with Mounjaro and other medications. A1c improved from 6.7 to 6.1. Discuss medication adjustments with endocrinologist. Vaccine advice: Declined vaccines today, recommend flu and COVID-vaccine. Due for colonoscopy.  Will send a referral RTC 2 months.

## 2024-06-06 NOTE — Patient Instructions (Signed)
 GO TO THE LAB :  Get the blood work    Then, go to the front desk for the checkout Please make an appointment for a checkup in 2 months   Discussed with endocrinology weight loss, could be related to Mounjaro  We are referring you to orthopedic doctor regards to the back pain.    Please read more detailed instructions below   UNINTENTIONAL WEIGHT LOSS: You have experienced a decrease in weight from 218-223 pounds to 200 pounds, which may be related to your medication, Mounjaro. -We will discuss adjusting your Mounjaro dosage with your endocrinologist. -We will order blood tests and a urinalysis to rule out other causes of weight loss. -If necessary, we may consider a CT scan of your abdomen.  CHRONIC BACK PAIN: You have been experiencing chronic lower back pain that sometimes radiates to your buttocks and legs. -We will refer you to an orthopedic specialist for further evaluation. -We will order a urinalysis to rule out kidney-related causes of your back pain.

## 2024-06-07 ENCOUNTER — Ambulatory Visit: Payer: Self-pay | Admitting: Internal Medicine

## 2024-06-07 LAB — URINALYSIS, ROUTINE W REFLEX MICROSCOPIC
Bilirubin Urine: NEGATIVE
Hgb urine dipstick: NEGATIVE
Leukocytes,Ua: NEGATIVE
Nitrite: NEGATIVE
RBC / HPF: NONE SEEN (ref 0–?)
Specific Gravity, Urine: 1.025 (ref 1.000–1.030)
Total Protein, Urine: NEGATIVE
Urine Glucose: >=1000 — AB
Urobilinogen, UA: 1 (ref 0.0–1.0)
WBC, UA: NONE SEEN (ref 0–?)
pH: 5.5 (ref 5.0–8.0)

## 2024-06-07 LAB — COMPREHENSIVE METABOLIC PANEL WITH GFR
ALT: 12 U/L (ref 0–53)
AST: 14 U/L (ref 0–37)
Albumin: 4.7 g/dL (ref 3.5–5.2)
Alkaline Phosphatase: 73 U/L (ref 39–117)
BUN: 13 mg/dL (ref 6–23)
CO2: 28 meq/L (ref 19–32)
Calcium: 9 mg/dL (ref 8.4–10.5)
Chloride: 103 meq/L (ref 96–112)
Creatinine, Ser: 0.6 mg/dL (ref 0.40–1.50)
GFR: 108.27 mL/min (ref >60.00)
Glucose, Bld: 103 mg/dL — ABNORMAL HIGH (ref 70–99)
Potassium: 4.3 meq/L (ref 3.5–5.1)
Sodium: 140 meq/L (ref 135–145)
Total Bilirubin: 1 mg/dL (ref 0.2–1.2)
Total Protein: 6.8 g/dL (ref 6.0–8.3)

## 2024-06-07 LAB — CBC WITH DIFFERENTIAL/PLATELET
Basophils Absolute: 0 K/uL (ref 0.0–0.1)
Basophils Relative: 0.3 % (ref 0.0–3.0)
Eosinophils Absolute: 0 K/uL (ref 0.0–0.7)
Eosinophils Relative: 0.6 % (ref 0.0–5.0)
HCT: 44.3 % (ref 39.0–52.0)
Hemoglobin: 15.1 g/dL (ref 13.0–17.0)
Lymphocytes Relative: 30.3 % (ref 12.0–46.0)
Lymphs Abs: 2.3 K/uL (ref 0.7–4.0)
MCHC: 34.1 g/dL (ref 30.0–36.0)
MCV: 89.9 fl (ref 78.0–100.0)
Monocytes Absolute: 0.6 K/uL (ref 0.1–1.0)
Monocytes Relative: 8.5 % (ref 3.0–12.0)
Neutro Abs: 4.5 K/uL (ref 1.4–7.7)
Neutrophils Relative %: 60.3 % (ref 43.0–77.0)
Platelets: 257 K/uL (ref 150.0–400.0)
RBC: 4.93 Mil/uL (ref 4.22–5.81)
RDW: 12.8 % (ref 11.5–15.5)
WBC: 7.4 K/uL (ref 4.0–10.5)

## 2024-06-07 LAB — URINE CULTURE
MICRO NUMBER:: 17067241
Result:: NO GROWTH
SPECIMEN QUALITY:: ADEQUATE

## 2024-06-07 LAB — MICROALBUMIN / CREATININE URINE RATIO
Creatinine,U: 86.9 mg/dL
Microalb Creat Ratio: 10.7 mg/g (ref 0.0–30.0)
Microalb, Ur: 0.9 mg/dL (ref 0.0–1.9)

## 2024-06-07 LAB — TSH: TSH: 0.63 u[IU]/mL (ref 0.35–5.50)

## 2024-06-07 NOTE — Addendum Note (Signed)
 Addended by: Sherry Rogus D on: 06/07/2024 07:41 AM   Modules accepted: Orders

## 2024-08-22 ENCOUNTER — Ambulatory Visit: Admitting: Internal Medicine

## 2024-08-26 ENCOUNTER — Other Ambulatory Visit: Payer: Self-pay | Admitting: Internal Medicine

## 2024-09-19 ENCOUNTER — Encounter: Payer: Self-pay | Admitting: Internal Medicine

## 2024-09-19 ENCOUNTER — Ambulatory Visit: Payer: Self-pay | Admitting: Internal Medicine

## 2024-09-19 ENCOUNTER — Ambulatory Visit (HOSPITAL_BASED_OUTPATIENT_CLINIC_OR_DEPARTMENT_OTHER)
Admission: RE | Admit: 2024-09-19 | Discharge: 2024-09-19 | Disposition: A | Discharge status: Home / Self Care | Source: Ambulatory Visit | Attending: Internal Medicine | Admitting: Internal Medicine

## 2024-09-19 ENCOUNTER — Ambulatory Visit (INDEPENDENT_AMBULATORY_CARE_PROVIDER_SITE_OTHER): Admitting: Internal Medicine

## 2024-09-19 VITALS — BP 102/68 | HR 64 | Temp 97.8°F | Resp 16 | Ht 69.0 in | Wt 191.2 lb

## 2024-09-19 DIAGNOSIS — Z85818 Personal history of malignant neoplasm of other sites of lip, oral cavity, and pharynx: Secondary | ICD-10-CM | POA: Insufficient documentation

## 2024-09-19 DIAGNOSIS — R634 Abnormal weight loss: Secondary | ICD-10-CM | POA: Insufficient documentation

## 2024-09-19 DIAGNOSIS — G8929 Other chronic pain: Secondary | ICD-10-CM | POA: Diagnosis not present

## 2024-09-19 DIAGNOSIS — M545 Low back pain, unspecified: Secondary | ICD-10-CM | POA: Diagnosis not present

## 2024-09-19 DIAGNOSIS — Z7985 Long-term (current) use of injectable non-insulin antidiabetic drugs: Secondary | ICD-10-CM | POA: Diagnosis not present

## 2024-09-19 DIAGNOSIS — E119 Type 2 diabetes mellitus without complications: Secondary | ICD-10-CM

## 2024-09-19 LAB — BASIC METABOLIC PANEL WITH GFR
BUN: 12 mg/dL (ref 6–23)
CO2: 29 meq/L (ref 19–32)
Calcium: 9.1 mg/dL (ref 8.4–10.5)
Chloride: 103 meq/L (ref 96–112)
Creatinine, Ser: 0.57 mg/dL (ref 0.40–1.50)
GFR: 109.74 mL/min
Glucose, Bld: 103 mg/dL — ABNORMAL HIGH (ref 70–99)
Potassium: 3.9 meq/L (ref 3.5–5.1)
Sodium: 137 meq/L (ref 135–145)

## 2024-09-19 NOTE — Patient Instructions (Addendum)
 Please read your instructions carefully.   GO TO THE LAB :  Get the blood work    Go to the front desk for the checkout Please make an appointment for a checkup in 4 months   Call gastroenterology and set up an appointment. I am referring you because the weight loss, diarrhea and you are also due for a colonoscopy [phone]  Is very important you contact endocrinology, I think you need to stop or decrease the dose of tirzepatide   Will schedule as CT of the abdomen and pelvis once we have the blood work come back.

## 2024-09-19 NOTE — Progress Notes (Unsigned)
 "  Subjective:    Patient ID: Dustin Goodman, male    DOB: 01-09-1966, 59 y.o.   MRN: [id number]  DOS:  09/19/2024 Follow-up  Discussed the use of AI scribe software for clinical note transcription with the patient, who gave verbal consent to proceed.  History of Present Illness Here for follow-up on weight loss  Since the last visit he has developed diarrhea for the last 3 weeks: Chronic diarrhea - 3 to 4 loose bowel movements daily - Symptoms are persistent but not disabling - Able to continue working - Uses Pepto-Bismol for symptom relief - No fever, chills, or blood in stool  Weight loss - Ongoing weight loss, first noted in October - No dysphagia - No cough except occasional cough in very cold, dry air - Prior blood and urine tests were normal  Diabetes mellitus management - Takes Mounjaro 12.5 mg once weekly - No recent change in dose  Chronic back pain - Chronic back pain, unchanged - Pain is worse when lying down - Sleeps sitting up on the couch due to pain   Wt Readings from Last 3 Encounters:  09/19/24 191 lb 4 oz (86.8 kg)  06/06/24 197 lb 4 oz (89.5 kg)  07/23/23 213 lb 8 oz (96.8 kg)     Review of Systems See above   Past Medical History:  Diagnosis Date   Allergy    seasonal   Cancer (HCC)    tonsil, 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   Cervical radiculopathy    Diabetes mellitus 1998   type II   Hyperlipidemia     Past Surgical History:  Procedure Laterality Date   CERVICAL SPINE SURGERY  10/25/2018   C3 to C7    CHOLECYSTECTOMY  1998   COLONOSCOPY  04/04/2021   MS   NECK DISSECTION Left 07/2018   TORS and left neck dissection for tonsillar cancer   SHOULDER ARTHROSCOPY Right 2010    Dr Melita     Current Outpatient Medications  Medication Instructions   atorvastatin  (LIPITOR) 20 mg, Oral, Daily   Blood Glucose Monitoring Suppl (ONETOUCH VERIO) w/Device KIT Check blood sugar 4 time daily   Dapagliflozin -metFORMIN  HCl ER (XIGDUO  XR)  10-500 MG TB24 1 tablet, Daily   glucose blood (ONETOUCH VERIO) test strip Check blood sugar 4 times daily   levocetirizine (XYZAL ) 5 mg, Oral, Daily   Mounjaro 10 mg, Weekly   Multiple Vitamins-Minerals (CENTRUM PO) 1 tablet, Daily   ONE TOUCH LANCETS MISC Check blood sugars 4 times daily       Objective:   Physical Exam BP 102/68   Pulse 64   Temp 97.8 F (36.6 C) (Oral)   Resp 16   Ht 5' 9 (1.753 m)   Wt 191 lb 4 oz (86.8 kg)   SpO2 96%   BMI 28.24 kg/m  General:   Well developed, NAD, BMI noted.  HEENT:  Normocephalic . Face symmetric, atraumatic Neck: No thyromegaly Lymphatic system: No lymphadenopathies at the neck, suprapubic area, axillary area or groins. Lungs:  CTA B Normal respiratory effort, no intercostal retractions, no accessory muscle use. Heart: RRR,  no murmur.  Abdomen:  Not distended, soft, non-tender. No rebound or rigidity. MSK: No TTP at the lumbosacral spine or SI Skin: Not pale. Not jaundice Lower extremities: no pretibial edema bilaterally  Neurologic:  alert & oriented X3.  Speech normal, gait appropriate for age and unassisted Psych--  Cognition and judgment appear intact.  Cooperative with normal attention span and  concentration.  Behavior appropriate. No anxious or depressed appearing.     Assessment   Assessment Diabetes- saw Dr Faythe from 2013 to 02-2016, back to endo 9/ 2019  Hyperlipidemia Seasonal allergies Tonsil cancer, s/p tors and left neck dissection 07-2018 Cervical spine surgery 10/2018 Fall at home 08/2020: Left L1-3 TP fractures and a non-displcaed left L4 TP fracture, treat conservatively  Diastasis recti   Unintentional weight loss: Since last visit, x-rays and labs were okay.  He continued to lose weight and in addition has developed diarrhea for the last 3 weeks. Etiology is probably GLP-1's however he has a history of cancer, back pain and now diarrhea. Physical exam is benign. Plan:  Chest x-ray BMP  follow-up by a  CT abdomen and pelvis. GI referral due to weight loss, diarrhea for 3 weeks.  He is also due for a colonoscopy. Chronic diarrhea: See above.  No recent antibiotics DM: Last A1c 6.1, he continued to lose weight, I urged the patient to reach endocrinology stop or decrease the dose of GLP-1's. Preventive care: Declined at flu and a COVID-vaccine.  Benefits discussed. Back pain: I also urged him to see his orthopedic doctor. RTC 4 months    "

## 2024-09-20 ENCOUNTER — Ambulatory Visit (HOSPITAL_BASED_OUTPATIENT_CLINIC_OR_DEPARTMENT_OTHER)
Admission: RE | Admit: 2024-09-20 | Discharge: 2024-09-20 | Disposition: A | Discharge status: Home / Self Care | Source: Ambulatory Visit | Attending: Internal Medicine | Admitting: Internal Medicine

## 2024-09-20 ENCOUNTER — Encounter: Payer: Self-pay | Admitting: Internal Medicine

## 2024-09-20 ENCOUNTER — Encounter: Payer: Self-pay | Admitting: Gastroenterology

## 2024-09-20 DIAGNOSIS — R634 Abnormal weight loss: Secondary | ICD-10-CM | POA: Insufficient documentation

## 2024-09-20 DIAGNOSIS — Z85818 Personal history of malignant neoplasm of other sites of lip, oral cavity, and pharynx: Secondary | ICD-10-CM | POA: Insufficient documentation

## 2024-09-20 MED ORDER — IOHEXOL 300 MG/ML  SOLN
75.0000 mL | Freq: Once | INTRAMUSCULAR | Status: AC | PRN
  Administered 2024-09-20: 75 mL via INTRAVENOUS

## 2024-09-20 NOTE — Assessment & Plan Note (Signed)
 Unintentional weight loss: Since last visit, x-rays and labs were okay.  He continued to lose weight and in addition has developed diarrhea for the last 3 weeks. Etiology is probably GLP-1's however he has a history of cancer, back pain and now diarrhea. Physical exam is benign. Plan:  Chest x-ray BMP follow-up by a  CT abdomen and pelvis. GI referral due to weight loss, diarrhea for 3 weeks.  He is also due for a colonoscopy. Chronic diarrhea: See above.  No recent antibiotics DM: Last A1c 6.1, he continued to lose weight, I urged the patient to reach endocrinology stop or decrease the dose of GLP-1's. Back pain: I also urged him to see his orthopedic doctor. Preventive care: Declined at flu and a COVID-vaccine.  Benefits discussed. RTC 4 months

## 2024-10-16 ENCOUNTER — Ambulatory Visit: Admitting: Gastroenterology

## 2025-01-17 ENCOUNTER — Ambulatory Visit: Admitting: Internal Medicine
